# Patient Record
Sex: Female | Born: 1980 | Race: Asian | Hispanic: No | Marital: Married | Smoking: Never smoker
Health system: Southern US, Community
[De-identification: ages and names within clinical notes are randomized; demographics above are authoritative.]

## PROBLEM LIST (undated history)

## (undated) ENCOUNTER — Ambulatory Visit (HOSPITAL_COMMUNITY): Admission: EM | Source: Home / Self Care

## (undated) DIAGNOSIS — J45909 Unspecified asthma, uncomplicated: Secondary | ICD-10-CM

## (undated) DIAGNOSIS — IMO0001 Reserved for inherently not codable concepts without codable children: Secondary | ICD-10-CM

## (undated) DIAGNOSIS — O24419 Gestational diabetes mellitus in pregnancy, unspecified control: Secondary | ICD-10-CM

## (undated) HISTORY — DX: Unspecified asthma, uncomplicated: J45.909

---

## 2008-03-19 ENCOUNTER — Encounter: Payer: Self-pay | Admitting: Sports Medicine

## 2008-04-01 ENCOUNTER — Encounter: Payer: Self-pay | Admitting: Sports Medicine

## 2008-04-01 ENCOUNTER — Ambulatory Visit: Payer: Self-pay | Admitting: Family Medicine

## 2008-04-01 LAB — CONVERTED CEMR LAB
Antibody Screen: NEGATIVE
Basophils Absolute: 0.1 10*3/uL (ref 0.0–0.1)
Basophils Relative: 1 % (ref 0–1)
Eosinophils Absolute: 0.5 K/uL (ref 0.0–0.7)
Eosinophils Relative: 5 % (ref 0–5)
HCT: 35.9 % — ABNORMAL LOW (ref 36.0–46.0)
Hemoglobin: 12.1 g/dL (ref 12.0–15.0)
Hepatitis B Surface Ag: NEGATIVE
Lymphocytes Relative: 25 % (ref 12–46)
Lymphs Abs: 2.5 10*3/uL (ref 0.7–4.0)
MCHC: 33.7 g/dL (ref 30.0–36.0)
MCV: 91.3 fL (ref 78.0–100.0)
Monocytes Absolute: 0.6 10*3/uL (ref 0.1–1.0)
Monocytes Relative: 6 % (ref 3–12)
Neutro Abs: 6.5 10*3/uL (ref 1.7–7.7)
Neutrophils Relative %: 65 % (ref 43–77)
Platelets: 215 10*3/uL (ref 150–400)
RBC: 3.93 M/uL (ref 3.87–5.11)
RDW: 12.6 % (ref 11.5–15.5)
Rh Type: POSITIVE
Rubella: 98.1 intl units/mL — ABNORMAL HIGH
Sickle Cell Screen: NEGATIVE
Urine culture (CF units/mL): NEGATIVE CFunits/mL
WBC: 10.1 10*3/uL (ref 4.0–10.5)

## 2008-04-02 ENCOUNTER — Encounter: Payer: Self-pay | Admitting: Sports Medicine

## 2008-04-08 ENCOUNTER — Ambulatory Visit: Payer: Self-pay | Admitting: Family Medicine

## 2008-04-08 ENCOUNTER — Encounter: Payer: Self-pay | Admitting: Sports Medicine

## 2008-04-08 LAB — CONVERTED CEMR LAB
Chlamydia, DNA Probe: NEGATIVE
GC Probe Amp, Genital: NEGATIVE
Glucose, Urine, Semiquant: 100
Protein, U semiquant: NEGATIVE

## 2008-04-18 ENCOUNTER — Encounter: Payer: Self-pay | Admitting: Sports Medicine

## 2008-05-06 ENCOUNTER — Ambulatory Visit: Payer: Self-pay | Admitting: Family Medicine

## 2008-05-12 ENCOUNTER — Ambulatory Visit: Payer: Self-pay | Admitting: Family Medicine

## 2008-06-20 ENCOUNTER — Ambulatory Visit: Payer: Self-pay | Admitting: Family Medicine

## 2008-06-20 DIAGNOSIS — O4400 Placenta previa specified as without hemorrhage, unspecified trimester: Secondary | ICD-10-CM

## 2008-06-20 DIAGNOSIS — O99810 Abnormal glucose complicating pregnancy: Secondary | ICD-10-CM

## 2008-06-21 ENCOUNTER — Encounter: Payer: Self-pay | Admitting: *Deleted

## 2008-06-27 ENCOUNTER — Telehealth: Payer: Self-pay | Admitting: Sports Medicine

## 2008-06-27 ENCOUNTER — Ambulatory Visit: Payer: Self-pay | Admitting: Obstetrics & Gynecology

## 2008-06-27 ENCOUNTER — Encounter: Admission: RE | Admit: 2008-06-27 | Discharge: 2008-09-25 | Payer: Self-pay | Admitting: Obstetrics & Gynecology

## 2008-06-28 ENCOUNTER — Telehealth (INDEPENDENT_AMBULATORY_CARE_PROVIDER_SITE_OTHER): Payer: Self-pay | Admitting: *Deleted

## 2008-07-04 ENCOUNTER — Ambulatory Visit: Payer: Self-pay | Admitting: Obstetrics & Gynecology

## 2008-07-11 ENCOUNTER — Ambulatory Visit: Payer: Self-pay | Admitting: Family Medicine

## 2008-07-11 ENCOUNTER — Encounter: Payer: Self-pay | Admitting: Family

## 2008-07-11 LAB — CONVERTED CEMR LAB
Hemoglobin: 12.4 g/dL (ref 12.0–15.0)
RDW: 12.7 % (ref 11.5–15.5)
WBC: 9.6 10*3/uL (ref 4.0–10.5)

## 2008-07-18 ENCOUNTER — Ambulatory Visit: Payer: Self-pay | Admitting: Obstetrics & Gynecology

## 2008-08-01 ENCOUNTER — Ambulatory Visit: Payer: Self-pay | Admitting: Family Medicine

## 2008-08-02 ENCOUNTER — Ambulatory Visit (HOSPITAL_COMMUNITY): Admission: RE | Admit: 2008-08-02 | Discharge: 2008-08-02 | Payer: Self-pay | Admitting: Family Medicine

## 2008-08-04 ENCOUNTER — Ambulatory Visit: Payer: Self-pay | Admitting: Obstetrics & Gynecology

## 2008-08-04 ENCOUNTER — Ambulatory Visit (HOSPITAL_COMMUNITY): Admission: RE | Admit: 2008-08-04 | Discharge: 2008-08-04 | Payer: Self-pay | Admitting: Family Medicine

## 2009-11-05 ENCOUNTER — Emergency Department (HOSPITAL_COMMUNITY): Admission: EM | Admit: 2009-11-05 | Discharge: 2009-11-05 | Payer: Self-pay | Admitting: Internal Medicine

## 2010-04-01 ENCOUNTER — Encounter: Payer: Self-pay | Admitting: Sports Medicine

## 2010-04-01 ENCOUNTER — Encounter: Payer: Self-pay | Admitting: *Deleted

## 2010-04-02 ENCOUNTER — Encounter: Payer: Self-pay | Admitting: *Deleted

## 2010-04-10 NOTE — Miscellaneous (Signed)
 Summary: Release of Responsibility for Interpretation  Release of Responsibility for Interpretation   Imported By: [facility] KIVETT 04/25/2008 09:25:15  _____________________________________________________________________  External Attachment:    Type:   Image     Comment:   External Document

## 2010-04-10 NOTE — Assessment & Plan Note (Signed)
 Summary: NOB/AAM/DSL   Vital Signs:  Patient Profile:   30 Years Old Female Weight:      116.4 pounds BP sitting:   106 / 70  Menstrual History: Best Working EDC: 09/07/2008 LMP - Character: normal LMP - Reliable: No Menarche: 15 Menses interval: 30 days Menstrual flow: 4 days On BCP's at conception: no                 PCP:  Marge Vandermeulen   History of Present Illness: 24 G1P0 presents for initial intake OB visit.  Accompanied by family member who will translate in vietnamese.  They have signed the waiver for [facility] translator.  Conception occurred in [place], and father of baby is unknown and still in [place].    [employer]    Past Medical History:    Reviewed history and no changes required:       None  Past Surgical History:    Reviewed history and no changes required:       None   Family History:    Reviewed history and no changes required:       None.  Social History:    Reviewed history and no changes required:       Ad[employer]-Mom       No A/T/D.       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       College educated.   Risk Factors:    Review of Systems       12 point ROS negative except as outlined in HPI and flowsheet.   Physical Exam  General:     Well-developed,well-nourished,in no acute distress; alert,appropriate and cooperative throughout examination Head:     Normocephalic and atraumatic without obvious abnormalities. No apparent alopecia or balding. Eyes:     No corneal or conjunctival inflammation noted. EOMI. Perrla. Funduscopic exam benign, without hemorrhages, exudates or papilledema. Vision grossly normal. Ears:     External ear exam shows no significant lesions or deformities.  Otoscopic examination reveals clear canals, tympanic membranes are intact bilaterally without bulging, retraction, inflammation or discharge. Hearing is grossly normal bilaterally. Nose:     External nasal examination shows no deformity or inflammation. Nasal  mucosa are pink and moist without lesions or exudates. Mouth:     Oral mucosa and oropharynx without lesions or exudates.  Teeth in good repair. Neck:     No deformities, masses, or tenderness noted. Chest Wall:     No deformities, masses, or tenderness noted. Lungs:     Normal respiratory effort, chest expands symmetrically. Lungs are clear to auscultation, no crackles or wheezes. Heart:     Normal rate and regular rhythm. S1 and S2 normal without gallop, murmur, click, rub or other extra sounds. Abdomen:     Bowel sounds positive,abdomen soft and non-tender, organomegaly or hernias noted.  Gravid. Genitalia:     Normal introitus for age, no external lesions, no vaginal discharge, mucosa pink and moist, no vaginal or cervical lesions, no vaginal atrophy, no friaility or hemorrhage, normal uterus size and position, no adnexal masses or tenderness Msk:     No deformity or scoliosis noted of thoracic or lumbar spine.   Extremities:     No clubbing, cyanosis, edema, or deformity noted with normal full range of motion of all joints.   Neurologic:     No cranial nerve deficits noted. Station and gait are normal. Plantar reflexes are down-going bilaterally. DTRs are symmetrical throughout. Sensory, motor and coordinative functions appear intact.  Skin:     Intact without suspicious lesions or rashes Cervical Nodes:     No lymphadenopathy noted Additional Exam:     FHR auscultated @ 166 BPM    Impression & Recommendations:  Problem # 1:  SUPERVISION OF NORMAL FIRST PREGNANCY (ICD-V22.0) Doing well, routing 18.2 weeks, FHR auscultated @ 166BPM.  RTC in 4 weeks.  18 weeks anatomy ultrasound ordered, patient to call and schedule.  Discussed plans for breastfeeding and anesthesia as well as any red flags that should prompt seeking medical attention.  They are Ad[employer]-Mom and plan to apply for emergency medicaid after deliver.  Refused Integrated and quad screens.  Undecided contraception,  desires epidural, breastfeeding.  For next visit: 1h glucola: UA showed 100 glucose Review anatomy U/S Routine OB visit stuff: red flags, weight gain, PNV compliance, interval changes.  @ 28 wks: RPR, HIV, Glucola, Kick counts,    Orders: Prenatal U/S > 14 weeks - 23194 (Prenatal U/S) GC/Chlamydia-[facility] (87591/87491) Pap Smear-[facility] ([id number]) Medicaid OB visit - [facility] (00786)   Complete Medication List: 1)  Prenatal Vitamins 0.8 Mg Tabs (Prenatal multivit-min-fe-fa) .... One tab by mouth daily   Patient Instructions: 1)  Nice to meet you today, 2)  I want to see you back in 4 weeks. 3)  You will get your ultrasound today. 4)  Everything seems to be going well in the pregnancy. 5)  If you start to feel contractions, have any vaginal bleeding, fluid leakage then call the office or go to the [facility] MAU.   6)  -Dr. ONEIDA.   Prescriptions: PRENATAL VITAMINS 0.8 MG TABS (PRENATAL MULTIVIT-MIN-FE-FA) One tab by mouth daily  #31 x 9   Entered and Authorized by:   DEBBY PETTIES MD   Signed by:   DEBBY PETTIES MD on 04/08/2008   Method used:   Print then Give to Patient   RxID:   [id number]    OB Initial Intake Information    Positive HCG by: self    Race: Oriental/Asian    Marital status: Married    Occupation: outside work    Type of work: Nails    Number of children at home: 0  FOB Information    Husband/Father of baby: Unknown    FOB occupation Unknown    FOB Comments: Unknown, conception in [place], first ultrasound in [place].  Menstrual History    Best Working EDC: 09/07/2008    LMP - Character: normal    LMP - Reliable? : No    Menarche: 15 years    Menses interval: 30 days    Menstrual flow 4 days    On BCP's at conception: no    Pre Pregnancy Weight: 100 lbs.    Symptoms since LMP: amenorrhea, fatigue, tender breasts, urinary frequency   Flowsheet View for Follow-up Visit    Estimated weeks of       gestation:     18 2/7     Weight:     116.4    Blood pressure:   106 / 70    Urine protein:       negative    Urine glucose:    100    Hx headache?     No    Nausea/vomiting?   No    Edema?     0    Bleeding?     no    Leakage/discharge?   no    Fetal activity:       N/A    Labor  symptoms?   no    Fundal height:      just below umbilicus    FHR:       166    Taking Vitamins?   Y    Smoking PPD:   n/a    Comment:     Initial OB visit, @ 18.2w now.    Next visit:     4 wk    Resident:     Curtis    Preceptor:     Rosalynn   Prenatal Visit    FOB name: Unknown Concerns noted: FOB is unknown, pregnancy determined by initial ultrasound in [place] @ 13 weeks.  LMP Unknown. EDC Confirmation:    New working [facility]: 09/07/2008    LMP reliable? No    EDC by conception date: 09/07/2008 Ultrasound Dating Information:    First U/S on 03/07/2008   Gest age: 65.6   EDC: 09/07/2008.    Gest age by current sono: 18W 2D    EDC by current sono: 09/07/2008   Past Pregnancy History    Gravida:     1    Term Births:     0    Premature Births:   0    Living Children:   0    Para:       0    Mult. Births:     0    Prev C-Section:   0    Prev. VBAC attempt?   none    Aborta:     0    Elect. Ab:     0    Spont. Ab:     0    Ectopics:     0   Genetic History    Genetic History Reviewed:     Thalassemia:     mother: no    Neural tube defect:   mother: no    Down's Syndrome:   mother: no    Tay-Sachs:     mother: no    Sickle Cell Dz/Trait:   mother: no    Hemophilia:     mother: no    Muscular Dystrophy:   mother: no    Cystic Fibrosis:   mother: no    Huntington's Dz:   mother: no    Mental Retardation:   mother: no    Fragile X:     mother: no    Other Genetic or       Chromosomal Dz:   mother: no    Child with other       birth defect:     mother: no    > 3 spont. abortions:   mother: no    Hx of stillbirth:     mother: no  Infection Risk History    Infection Risk History Reviewed:    High Risk  Hepatitis B: no    Immunized against Hepatitis B: no    Exposure to TB: no    Patient with history of Genital Herpes: no    Sexual partner with history of Genital Herpes: no    History of STD (GC, Chlamydia, Syphilis, HPV): no    Rash, Viral, or Febrile Illness since LMP: no    Exposure to Cat Litter: no    Chicken Pox Immune Status: Non-immune    History of Parvovirus (Fifth Disease): no    Occupational Exposure to Children: none  Environmental Exposures    Environmental Exposures Reviewed:    Xray Exposure since LMP: no  Chemical or other exposure: no    Medication, drug, or alcohol use since LMP: no   Prenatal Lab Monitoring, Entry, and Tracking  Initial Prenatal Lab:  TEST                  VALUE          DATE           REVIEWED  Blood Type:           B+             04/01/2008      Urine Culture:        negative       04/01/2008          Laboratory Results   Urine Tests  Date/Time Received: April 08, 2008 2:13 PM  Date/Time Reported: April 08, 2008 2:41 PM   Routine Urinalysis   Glucose: 100   (Normal Range: Negative) Protein: negative   (Normal Range: Negative)    Comments: ...............test performed by......SABRABonnie A. Jordan, MT (ASCP)

## 2010-04-10 NOTE — Progress Notes (Signed)
 Summary: phn msg  Phone Note Call from Patient Call back at [facility] Phone [phone]   Caller: Patient Summary of Call: needs to ask question/would not say what. Initial call taken by: Karna Seminole,  June 27, 2008 3:09 PM  Follow-up for Phone Call        [facility] & [facility] want [facility] ultrasound to see how the baby is moving according to pt. she is reluctant to do this as she has no insurance & no money. wants pcp to know & to determine if she absolutely must do this. message to pcp Follow-up by: Ginnie Mau RN,  June 27, 2008 3:10 PM  Additional Follow-up for Phone Call Additional follow up Details #1::        She is Adopt-[employer]-mom, that should help with some of the cost but I would RECOMMEND listening to the High Risk folks.    She does have the capacity to make the decision however if she doesn't want the ultrasound.  Please let her know. Additional Follow-up by: Debby Petties MD,  June 27, 2008 3:42 PM      Appended Document: phn msg I tried to reach her but her voice mail has not been set up  Appended Document: phn msg left message last night. reached her this am. told her pcp here strongly recommends she get the u/s. told her to check with [employer] for help with cost. she is still not sure she wants to spend the money.

## 2010-04-10 NOTE — Assessment & Plan Note (Signed)
 Summary: ob/kh   Vital Signs:  Patient profile:   30 year old female Weight:      134.8 pounds Pulse rate:   81 / minute BP sitting:   105 / 62  Vitals Entered By: Katie Mulberry LPN (June 20, 2008 9:19 AM) CC: ob f/u Pain Assessment Patient in pain? no        History of Present Illness: 67F at 28.5 weeks, routine OB visit.  No complaints, no warning signs or red flags, has partial placenta previa no bleeding, no CTX, no fluid leakage.    Habits & Providers     Cigarette Packs/Day: n/a  Past History:  Past Medical History:    None (04/08/2008)  Past Surgical History:    None (04/08/2008)  Family History:    None. (04/08/2008)  Social History:    Adopt-a-Mom    No A/T/D.    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    College educated. (04/08/2008)  Review of Systems       12 point negative except as in HPI  Physical Exam  General:  Well-developed,well-nourished,in no acute distress; alert,appropriate and cooperative throughout examination Lungs:  Normal respiratory effort, chest expands symmetrically. Lungs are clear to auscultation, no crackles or wheezes. Heart:  Normal rate and regular rhythm. S1 and S2 normal without gallop, murmur, click, rub or other extra sounds. Abdomen:  soft, gravid, measures 28 cm, +BS   Impression & Recommendations:  Problem # 1:  SUPERVISION OF NORMAL FIRST PREGNANCY (ICD-V22.0) Pt with partial placenta previa on last ultrasound, no bleeding, asymptomatic.   Elevated 1h Glucola: GDM.  Otherwise normal pregnancy, discussed red flags and labor signs and symptoms.    -Will need serial ultrasounds q4 weeks to assess for change in placenta previa.   -Will also need referral to [facility] for 1h glucola:202.  Referral made.    Orders: Glucose 1 hr-[facility] (82950) Medicaid OB visit - [facility] (00786) Obstetric Referral (Obstetric)  Complete Medication List: 1)  Prenatal Vitamins 0.8 Mg Tabs (Prenatal multivit-min-fe-fa) .... One tab by  mouth daily  Patient Instructions: 1)  Your pregnancy seems to be going normally.  Come back to see me in 2 weeks.  If you have any vaginal bleeding, stop feeling the baby kick, fluid leakage, or regular contractions more than every 5 mins then go straight to [facility] for evaluation. 2)  We will do your 1 hour glucola today. 3)  -Dr. IVAR [place] for Follow-up Visit    Estimated weeks of       gestation:     28 5/7    Weight:     134.8    Blood pressure:   105 / 62    Headache:     No    Nausea/vomiting:   No    Edema:     0    Vaginal bleeding:   no    Vaginal discharge:   no    Fundal height:      28    FHR:       140    Fetal activity:     yes    Labor symptoms:   no    Fetal position:     transv    Taking prenatal vits?   Y    Smoking:     n/a    Next visit:     2 wk    Resident:     Curtis  Preceptor:     Levonne    Comment:     No complaints, feels baby kicking often.    Flowsheet View for Follow-up Visit    Estimated weeks of       gestation:     28 5/7    Weight:     134.8    Blood pressure:   105 / 62    Hx headache?     No    Nausea/vomiting?   No    Edema?     0    Bleeding?     no    Leakage/discharge?   no    Fetal activity:       yes    Labor symptoms?   no    Fundal height:      28    FHR:       140    Fetal position:      transv    Taking Vitamins?   Y    Smoking PPD:   n/a    Comment:     No complaints, feels baby kicking often.    Next visit:     2 wk    Resident:     Curtis    Preceptor:     Levonne

## 2010-06-19 LAB — POCT URINALYSIS DIP (DEVICE)
Bilirubin Urine: NEGATIVE
Bilirubin Urine: NEGATIVE
Glucose, UA: NEGATIVE mg/dL
Glucose, UA: NEGATIVE mg/dL
Hgb urine dipstick: NEGATIVE
Hgb urine dipstick: NEGATIVE
Hgb urine dipstick: NEGATIVE
Ketones, ur: 15 mg/dL — AB
Ketones, ur: NEGATIVE mg/dL
Ketones, ur: NEGATIVE mg/dL
Nitrite: NEGATIVE
Nitrite: NEGATIVE
Specific Gravity, Urine: 1.015 (ref 1.005–1.030)
Specific Gravity, Urine: 1.015 (ref 1.005–1.030)
Specific Gravity, Urine: 1.02 (ref 1.005–1.030)
Urobilinogen, UA: 0.2 mg/dL (ref 0.0–1.0)
pH: 6.5 (ref 5.0–8.0)
pH: 6.5 (ref 5.0–8.0)
pH: 6.5 (ref 5.0–8.0)

## 2010-06-20 LAB — POCT URINALYSIS DIP (DEVICE)
Bilirubin Urine: NEGATIVE
Glucose, UA: NEGATIVE mg/dL
Glucose, UA: NEGATIVE mg/dL
Hgb urine dipstick: NEGATIVE
Ketones, ur: NEGATIVE mg/dL
Ketones, ur: NEGATIVE mg/dL
Nitrite: NEGATIVE
Protein, ur: NEGATIVE mg/dL
Specific Gravity, Urine: 1.02 (ref 1.005–1.030)
Urobilinogen, UA: 0.2 mg/dL (ref 0.0–1.0)
pH: 6.5 (ref 5.0–8.0)
pH: 7 (ref 5.0–8.0)

## 2011-03-12 NOTE — L&D Delivery Note (Signed)
Operative Delivery Note At 7:39 PM a viable and healthy female was delivered via Vaginal, Vacuum Investment banker, operational).  Presentation: vertex; Position: Left,, Occiput,, Anterior; Station: +3.  Vacuum delivery performed with the kiwi vacuum due to maternal exhaustion and poor maternal expulsive effort.  The vacuum was applied at +3 station.  Initially the patient pushed poorly and pop off x 1 occurred.  After coaching the patient pushed and the head was guided to crowning with the vacuum.  The vacuum was disengaged and the infants body was then delivered.  The infant cried vigorously after delivery and was passed to the waiting maternal abdomen.  The cord was clamped and cut and the placenta delivered spontaneously, intact, with 3VC. A 1st degree vaginal laceration was repaired with 3-0 vicryl.  EBL 350cc  Verbal consent: obtained from patient.  Risks and benefits discussed in detail.  Risks include, but are not limited to the risks of anesthesia, bleeding, infection, damage to maternal tissues, fetal cephalhematoma.  There is also the risk of inability to effect vaginal delivery of the head, or shoulder dystocia that cannot be resolved by established maneuvers, leading to the need for emergency cesarean section.  APGAR: 8, 9; weight 7 lb 13.9 oz (3569 g).   Placenta status: Intact, Spontaneous.   Cord: 3 vessels  Anesthesia: Epidural  Instruments: kiwi vacuum Episiotomy: None Lacerations: 1st degree;Vaginal Suture Repair: 3.0 vicryl Est. Blood Loss (mL): 350cc  Mom to postpartum.  Baby to nursery-stable.  Earnie Bechard H. 11/20/2011, 8:10 PM

## 2011-04-17 ENCOUNTER — Other Ambulatory Visit: Payer: Self-pay | Admitting: Obstetrics and Gynecology

## 2011-05-08 LAB — OB RESULTS CONSOLE RPR
RPR: NONREACTIVE
RPR: NONREACTIVE

## 2011-05-08 LAB — OB RESULTS CONSOLE HIV ANTIBODY (ROUTINE TESTING): HIV: NONREACTIVE

## 2011-05-11 ENCOUNTER — Encounter (HOSPITAL_COMMUNITY): Payer: Self-pay | Admitting: Emergency Medicine

## 2011-05-11 ENCOUNTER — Emergency Department (HOSPITAL_COMMUNITY)
Admission: EM | Admit: 2011-05-11 | Discharge: 2011-05-12 | Disposition: A | Discharge status: Home / Self Care | Payer: Medicaid Other | Attending: Emergency Medicine | Admitting: Emergency Medicine

## 2011-05-11 DIAGNOSIS — N898 Other specified noninflammatory disorders of vagina: Secondary | ICD-10-CM | POA: Insufficient documentation

## 2011-05-11 DIAGNOSIS — N949 Unspecified condition associated with female genital organs and menstrual cycle: Secondary | ICD-10-CM | POA: Insufficient documentation

## 2011-05-11 DIAGNOSIS — R109 Unspecified abdominal pain: Secondary | ICD-10-CM | POA: Insufficient documentation

## 2011-05-11 DIAGNOSIS — O9989 Other specified diseases and conditions complicating pregnancy, childbirth and the puerperium: Secondary | ICD-10-CM | POA: Insufficient documentation

## 2011-05-11 DIAGNOSIS — O26899 Other specified pregnancy related conditions, unspecified trimester: Secondary | ICD-10-CM

## 2011-05-11 HISTORY — DX: Reserved for inherently not codable concepts without codable children: IMO0001

## 2011-05-11 LAB — URINE MICROSCOPIC-ADD ON

## 2011-05-11 LAB — URINALYSIS, ROUTINE W REFLEX MICROSCOPIC
Bilirubin Urine: NEGATIVE
Glucose, UA: NEGATIVE mg/dL
Ketones, ur: NEGATIVE mg/dL
Nitrite: NEGATIVE
Protein, ur: NEGATIVE mg/dL
Specific Gravity, Urine: 1.016 (ref 1.005–1.030)
Urobilinogen, UA: 0.2 mg/dL (ref 0.0–1.0)
pH: 6 (ref 5.0–8.0)

## 2011-05-11 NOTE — ED Notes (Signed)
Patient complaining of abdominal pain that stretches across her abdomen since this morning; patient 3 months pregnant.  Patient states that she also had a scant amount of vaginal bleeding this morning; denies bleeding at this moment. Denies nausea, vomiting, and diarrhea.

## 2011-05-12 ENCOUNTER — Emergency Department (HOSPITAL_COMMUNITY): Payer: Medicaid Other

## 2011-05-12 LAB — HCG, QUANTITATIVE, PREGNANCY: hCG, Beta Chain, Quant, S: 50838 m[IU]/mL — ABNORMAL HIGH (ref <5)

## 2011-05-12 NOTE — ED Provider Notes (Signed)
History     CSN: [id number]  Arrival date & time 05/11/11  2042   First MD Initiated Contact with Patient 05/11/11 2220      Chief Complaint  Patient presents with  . Abdominal Pain  . Vaginal Bleeding    (Consider location/radiation/quality/duration/timing/severity/associated sxs/prior treatment) HPI Patient presents to the emergency department with lower abdominal pain and a minimal amount of vaginal bleeding that occurred this morning.  Patient is 3 months pregnant and had no complications thus far during her pregnancy.  Patient states that the bleeding was very small amount.  She denies chest pain, shortness of breath, nausea/vomiting, back pain,or vaginal discomfort.  She also denies having any fevers.  Patient states that the bleeding has not occurred since this morning and she is not having any current pain.  She states that she is here to make sure that everything is okay with her pregnancy. Past Medical History  Diagnosis Date  . No significant active problems     History reviewed. No pertinent past surgical history.  History reviewed. No pertinent family history.  History  Substance Use Topics  . Smoking status: Never Smoker   . Smokeless tobacco: Not on file  . Alcohol Use: No      Review of Systems All pertinent positives and negatives reviewed in the history of present illness  Allergies  Review of patient's allergies indicates no known allergies.  Home Medications   Current Outpatient Rx  Name Route Sig Dispense Refill  . ACETAMINOPHEN 325 MG PO TABS Oral Take 325 mg by mouth every 6 (six) hours as needed. For pain    . PRENATAL 27-0.8 MG PO TABS Oral Take 1 tablet by mouth daily.      BP 98/59  Pulse 76  Temp(Src) 98.2 F (36.8 C) (Oral)  Resp 16  SpO2 99%  Physical Exam  Constitutional: He is oriented to person, place, and time. He appears well-developed and well-nourished.  HENT:  Head: Normocephalic and atraumatic.  Eyes: Pupils are equal,  round, and reactive to light.  Cardiovascular: Normal rate, regular rhythm, normal heart sounds and intact distal pulses.  Exam reveals no gallop and no friction rub.   No murmur heard. Pulmonary/Chest: Effort normal and breath sounds normal. No respiratory distress. He has no wheezes. He has no rales.  Abdominal: Soft. Bowel sounds are normal. He exhibits no distension. There is no tenderness. There is no rebound and no guarding.  Genitourinary:       The patient has no vaginal bleeding on exam.  Has no other signs of discharge.  Neurological: He is alert and oriented to person, place, and time.  Skin: Skin is warm and dry. No rash noted.    ED Course  Procedures (including critical care time)  Labs Reviewed  CBC - Abnormal; Notable for the following:    WBC 13.7 (*)    RBC 3.92 (*)    Hemoglobin 12.4 (*)    HCT 34.7 (*)    All other components within normal limits  BASIC METABOLIC PANEL - Abnormal; Notable for the following:    Creatinine, Ser 0.42 (*)    All other components within normal limits  URINALYSIS, ROUTINE W REFLEX MICROSCOPIC - Abnormal; Notable for the following:    APPearance CLOUDY (*)    Hgb urine dipstick TRACE (*)    Leukocytes, UA SMALL (*)    All other components within normal limits  HCG, QUANTITATIVE, PREGNANCY - Abnormal; Notable for the following:    hCG,  Beta Francene Finders [id number] (*)    All other components within normal limits  URINE MICROSCOPIC-ADD ON - Abnormal; Notable for the following:    Squamous Epithelial / LPF FEW (*)    Bacteria, UA FEW (*)    All other components within normal limits  LAB REPORT - SCANNED   US Ob Comp Less 14 Wks  05/12/2011  *RADIOLOGY REPORT*  Clinical Data: Vaginal bleeding  LIMITED OBSTETRIC ULTRASOUND  Number of Fetuses: 1 Heart Rate: 175bpm Movement: Yes Presentation: Variable Placental Location: Low lying Previa: Marginal Amniotic Fluid (Subjective): Normal  BPD: 2.2cm   13w   5d  MATERNAL FINDINGS: Cervix: Closed/  Uterus/Adnexae: Normal  IMPRESSION:  1.  Single living intrauterine pregnancy 2.  Low lying, and marginally located placenta.  Recommend followup with non-emergent complete OB 14+ wk US examination for fetal biometric evaluation and anatomic survey. This could be performed at the [facility] [place].  Original Report Authenticated By: Rosealee Albee, M.D.   US Ob Transvaginal  05/12/2011  *RADIOLOGY REPORT*  Clinical Data: Vaginal bleeding  LIMITED OBSTETRIC ULTRASOUND  Number of Fetuses: 1 Heart Rate: 175bpm Movement: Yes Presentation: Variable Placental Location: Low lying Previa: Marginal Amniotic Fluid (Subjective): Normal  BPD: 2.2cm   13w   5d  MATERNAL FINDINGS: Cervix: Closed/ Uterus/Adnexae: Normal  IMPRESSION:  1.  Single living intrauterine pregnancy 2.  Low lying, and marginally located placenta.  Recommend followup with non-emergent complete OB 14+ wk US examination for fetal biometric evaluation and anatomic survey. This could be performed at the [facility] [place].  Original Report Authenticated By: Rosealee Albee, M.D.     1. Pregnancy related pelvic pain, antepartum    Patient has not had any further pain or vaginal bleeding here in the emergency department.  Her ultrasound does not show any abnormalities.  Patient is advised to go to the [facility] for any further issues or worsening in her condition.  Husband states they tried to go there but did not realize there is an area could not be evaluated.  Patient does have a GYN doctor and I have advised him to followup with them next week.  Patient is also advised to return here as needed.  All questions and concerns were addressed.   MDM  MDM Reviewed: nursing note and vitals Interpretation: labs and ultrasound            Carlyle Dolly, PA-C 05/12/11 1506

## 2011-05-12 NOTE — Discharge Instructions (Signed)
Return here as needed. Follow up at [facility] for any worsening in your condition.

## 2011-05-12 NOTE — ED Notes (Signed)
Patient back from US.

## 2011-05-12 NOTE — ED Provider Notes (Signed)
Medical screening examination/treatment/procedure(s) were performed by non-physician practitioner and as supervising physician I was immediately available for consultation/collaboration.  Kahner Yanik, MD 05/12/11 1637 

## 2011-05-29 LAB — BASIC METABOLIC PANEL
BUN: 8 mg/dL (ref 6–23)
CO2: 25 mEq/L (ref 19–32)
Calcium: 9.8 mg/dL (ref 8.4–10.5)
Chloride: 102 mEq/L (ref 96–112)
Creatinine, Ser: 0.42 mg/dL — ABNORMAL LOW (ref 0.50–1.10)
GFR calc Af Amer: >90 mL/min (ref >90)
GFR calc non Af Amer: >90 mL/min (ref >90)
Glucose, Bld: 98 mg/dL (ref 70–99)
Potassium: 3.6 mEq/L (ref 3.5–5.1)
Sodium: 138 mEq/L (ref 135–145)

## 2011-05-29 LAB — CBC
HCT: 34.7 % — ABNORMAL LOW (ref 36.0–46.0)
Hemoglobin: 12.4 g/dL (ref 12.0–15.0)
MCH: 31.6 pg (ref 26.0–34.0)
MCHC: 35.7 g/dL (ref 30.0–36.0)
MCV: 88.5 fL (ref 78.0–100.0)
Platelets: 242 10*3/uL (ref 150–400)
RBC: 3.92 MIL/uL (ref 3.87–5.11)
RDW: 12.1 % (ref 11.5–15.5)
WBC: 13.7 10*3/uL — ABNORMAL HIGH (ref 4.0–10.5)

## 2011-06-05 ENCOUNTER — Other Ambulatory Visit (HOSPITAL_COMMUNITY): Payer: Self-pay | Admitting: Obstetrics and Gynecology

## 2011-06-05 ENCOUNTER — Other Ambulatory Visit: Payer: Self-pay

## 2011-06-05 DIAGNOSIS — O2690 Pregnancy related conditions, unspecified, unspecified trimester: Secondary | ICD-10-CM

## 2011-06-05 DIAGNOSIS — Z3689 Encounter for other specified antenatal screening: Secondary | ICD-10-CM

## 2011-06-19 ENCOUNTER — Ambulatory Visit (HOSPITAL_COMMUNITY)
Admission: RE | Admit: 2011-06-19 | Discharge: 2011-06-19 | Disposition: A | Discharge status: Home / Self Care | Payer: Medicaid Other | Source: Ambulatory Visit | Attending: Obstetrics and Gynecology | Admitting: Obstetrics and Gynecology

## 2011-06-19 ENCOUNTER — Encounter (HOSPITAL_COMMUNITY): Payer: Self-pay

## 2011-06-19 VITALS — BP 107/67 | HR 73 | Wt 133.0 lb

## 2011-06-19 DIAGNOSIS — Z3689 Encounter for other specified antenatal screening: Secondary | ICD-10-CM

## 2011-06-19 DIAGNOSIS — Z363 Encounter for antenatal screening for malformations: Secondary | ICD-10-CM | POA: Insufficient documentation

## 2011-06-19 DIAGNOSIS — O2690 Pregnancy related conditions, unspecified, unspecified trimester: Secondary | ICD-10-CM

## 2011-06-19 DIAGNOSIS — Z8751 Personal history of pre-term labor: Secondary | ICD-10-CM | POA: Insufficient documentation

## 2011-06-19 DIAGNOSIS — O358XX0 Maternal care for other (suspected) fetal abnormality and damage, not applicable or unspecified: Secondary | ICD-10-CM | POA: Insufficient documentation

## 2011-06-19 DIAGNOSIS — Z1389 Encounter for screening for other disorder: Secondary | ICD-10-CM | POA: Insufficient documentation

## 2011-06-19 NOTE — Progress Notes (Signed)
Obstetric ultrasound performed today.    Patient had a nuchal translucency measurement in her primary OB office that was found to be at the 96th percentile for gestational age 30 1/[redacted] weeks gestation (2.5 mm).  First trimester screening was completed and results indicate no increased risks for fetal aneuploidy.  MSAFP is also WNL.    Ultrasound findings were discussed today and the patient spent time speaking with the genetic counselor.  She declined any further testing for fetal aneuploidy.  The option of fetal echo was reviewed and she elected to proceed with this.      Fetal measurements consistent with dating by early ultrasound Normal amniotic fluid volume Normal fetal anatomic survey (some limited face and heart views) No markers of aneuploidy identified  Repeat utlrasound scheduled in 6 weeks to re evaluate fetal anatomy.  Fetal echo also scheduled.  Please see full report in [facility].

## 2011-07-03 ENCOUNTER — Ambulatory Visit (HOSPITAL_COMMUNITY): Payer: Medicaid Other

## 2011-07-05 ENCOUNTER — Encounter (HOSPITAL_COMMUNITY): Payer: Self-pay

## 2011-07-31 ENCOUNTER — Ambulatory Visit (HOSPITAL_COMMUNITY): Payer: Medicaid Other

## 2011-08-07 ENCOUNTER — Ambulatory Visit (HOSPITAL_COMMUNITY)
Admission: RE | Admit: 2011-08-07 | Discharge: 2011-08-07 | Disposition: A | Discharge status: Home / Self Care | Payer: Medicaid Other | Source: Ambulatory Visit | Attending: Obstetrics and Gynecology | Admitting: Obstetrics and Gynecology

## 2011-08-07 VITALS — BP 99/65 | HR 82 | Wt 134.0 lb

## 2011-08-07 DIAGNOSIS — O358XX0 Maternal care for other (suspected) fetal abnormality and damage, not applicable or unspecified: Secondary | ICD-10-CM | POA: Insufficient documentation

## 2011-08-07 DIAGNOSIS — O2690 Pregnancy related conditions, unspecified, unspecified trimester: Secondary | ICD-10-CM

## 2011-08-07 DIAGNOSIS — Z8751 Personal history of pre-term labor: Secondary | ICD-10-CM | POA: Insufficient documentation

## 2011-08-07 DIAGNOSIS — Z3689 Encounter for other specified antenatal screening: Secondary | ICD-10-CM

## 2011-08-07 NOTE — Progress Notes (Signed)
Patient seen today  for follow up ultrasound.  See full report in [facility].  IUP at 24 weeks 1 day Interval growth is appropriate (48th %) Mild, left sided renal pylectasis is noted without evidence of calyceal dilation Normal amniotic fluid volume Normal fetal echo by peds cardiolgy.  Repeat utlrasound scheduled in 6 weeks to re evaluate the fetal kidneys.  Alpha Gula, MD

## 2011-09-18 ENCOUNTER — Encounter (HOSPITAL_COMMUNITY): Payer: Self-pay

## 2011-09-18 ENCOUNTER — Ambulatory Visit (HOSPITAL_COMMUNITY)
Admission: RE | Admit: 2011-09-18 | Discharge: 2011-09-18 | Disposition: A | Discharge status: Home / Self Care | Payer: Medicaid Other | Source: Ambulatory Visit | Attending: Obstetrics and Gynecology | Admitting: Obstetrics and Gynecology

## 2011-09-18 DIAGNOSIS — O358XX0 Maternal care for other (suspected) fetal abnormality and damage, not applicable or unspecified: Secondary | ICD-10-CM | POA: Insufficient documentation

## 2011-09-18 DIAGNOSIS — Z8751 Personal history of pre-term labor: Secondary | ICD-10-CM | POA: Insufficient documentation

## 2011-09-18 DIAGNOSIS — O09299 Supervision of pregnancy with other poor reproductive or obstetric history, unspecified trimester: Secondary | ICD-10-CM | POA: Insufficient documentation

## 2011-10-18 ENCOUNTER — Other Ambulatory Visit (HOSPITAL_COMMUNITY): Payer: Self-pay | Admitting: Obstetrics and Gynecology

## 2011-10-18 DIAGNOSIS — O358XX0 Maternal care for other (suspected) fetal abnormality and damage, not applicable or unspecified: Secondary | ICD-10-CM

## 2011-10-18 DIAGNOSIS — N2889 Other specified disorders of kidney and ureter: Secondary | ICD-10-CM

## 2011-10-18 DIAGNOSIS — Z8751 Personal history of pre-term labor: Secondary | ICD-10-CM

## 2011-10-18 DIAGNOSIS — O09299 Supervision of pregnancy with other poor reproductive or obstetric history, unspecified trimester: Secondary | ICD-10-CM

## 2011-10-23 ENCOUNTER — Ambulatory Visit (HOSPITAL_COMMUNITY)
Admission: RE | Admit: 2011-10-23 | Discharge: 2011-10-23 | Disposition: A | Discharge status: Home / Self Care | Payer: Medicaid Other | Source: Ambulatory Visit | Attending: Obstetrics and Gynecology | Admitting: Obstetrics and Gynecology

## 2011-10-23 DIAGNOSIS — O09299 Supervision of pregnancy with other poor reproductive or obstetric history, unspecified trimester: Secondary | ICD-10-CM | POA: Insufficient documentation

## 2011-10-23 DIAGNOSIS — N2889 Other specified disorders of kidney and ureter: Secondary | ICD-10-CM

## 2011-10-23 DIAGNOSIS — O358XX0 Maternal care for other (suspected) fetal abnormality and damage, not applicable or unspecified: Secondary | ICD-10-CM | POA: Insufficient documentation

## 2011-10-23 DIAGNOSIS — Z8751 Personal history of pre-term labor: Secondary | ICD-10-CM | POA: Insufficient documentation

## 2011-10-24 NOTE — Progress Notes (Signed)
Jessica Perry was seen for ultrasound appointment today.  Please see [facility] report for details.

## 2011-11-20 ENCOUNTER — Inpatient Hospital Stay (HOSPITAL_COMMUNITY)
Admission: AD | Admit: 2011-11-20 | Discharge: 2011-11-23 | DRG: 767 | Disposition: A | Discharge status: Home / Self Care | Payer: Medicaid Other | Source: Ambulatory Visit | Attending: Obstetrics and Gynecology | Admitting: Obstetrics and Gynecology

## 2011-11-20 ENCOUNTER — Encounter (HOSPITAL_COMMUNITY): Payer: Self-pay | Admitting: Anesthesiology

## 2011-11-20 ENCOUNTER — Encounter (HOSPITAL_COMMUNITY): Payer: Self-pay | Admitting: *Deleted

## 2011-11-20 ENCOUNTER — Telehealth (HOSPITAL_COMMUNITY): Payer: Self-pay | Admitting: *Deleted

## 2011-11-20 ENCOUNTER — Inpatient Hospital Stay (HOSPITAL_COMMUNITY): Payer: Medicaid Other | Admitting: Anesthesiology

## 2011-11-20 DIAGNOSIS — O4400 Placenta previa specified as without hemorrhage, unspecified trimester: Secondary | ICD-10-CM

## 2011-11-20 DIAGNOSIS — O99810 Abnormal glucose complicating pregnancy: Secondary | ICD-10-CM

## 2011-11-20 DIAGNOSIS — Z3480 Encounter for supervision of other normal pregnancy, unspecified trimester: Secondary | ICD-10-CM

## 2011-11-20 DIAGNOSIS — Z302 Encounter for sterilization: Secondary | ICD-10-CM

## 2011-11-20 HISTORY — DX: Gestational diabetes mellitus in pregnancy, unspecified control: O24.419

## 2011-11-20 LAB — ABO/RH: ABO/RH(D): B POS

## 2011-11-20 LAB — CBC
Hemoglobin: 12.3 g/dL (ref 12.0–15.0)
RBC: 4 MIL/uL (ref 3.87–5.11)
WBC: 13.7 10*3/uL — ABNORMAL HIGH (ref 4.0–10.5)

## 2011-11-20 LAB — RPR: RPR Ser Ql: NONREACTIVE

## 2011-11-20 LAB — TYPE AND SCREEN: Antibody Screen: NEGATIVE

## 2011-11-20 MED ORDER — ZOLPIDEM TARTRATE 5 MG PO TABS: 5.0000 mg | ORAL_TABLET | Freq: Every evening | ORAL | Status: DC | PRN

## 2011-11-20 MED ORDER — LIDOCAINE HCL (PF) 1 % IJ SOLN
30.0000 mL | INTRAMUSCULAR | Status: DC | PRN
  Filled 2011-11-20: qty 30

## 2011-11-20 MED ORDER — ONDANSETRON HCL 4 MG/2ML IJ SOLN
4.0000 mg | Freq: Four times a day (QID) | INTRAMUSCULAR | Status: DC | PRN
  Administered 2011-11-20: 4 mg via INTRAVENOUS
  Filled 2011-11-20: qty 2

## 2011-11-20 MED ORDER — EPHEDRINE 5 MG/ML INJ: 10.0000 mg | INTRAVENOUS | Status: DC | PRN

## 2011-11-20 MED ORDER — PRENATAL MULTIVITAMIN CH: 1.0000 | ORAL_TABLET | Freq: Every day | ORAL | Status: DC

## 2011-11-20 MED ORDER — DIPHENHYDRAMINE HCL 25 MG PO CAPS: 25.0000 mg | ORAL_CAPSULE | Freq: Four times a day (QID) | ORAL | Status: DC | PRN

## 2011-11-20 MED ORDER — OXYTOCIN BOLUS FROM INFUSION
500.0000 mL | Freq: Once | INTRAVENOUS | Status: DC
  Filled 2011-11-20: qty 500

## 2011-11-20 MED ORDER — FENTANYL 2.5 MCG/ML BUPIVACAINE 1/10 % EPIDURAL INFUSION (WH - ANES): 14.0000 mL/h | INTRAMUSCULAR | Status: DC

## 2011-11-20 MED ORDER — BENZOCAINE-MENTHOL 20-0.5 % EX AERO
1.0000 "application " | INHALATION_SPRAY | CUTANEOUS | Status: DC | PRN
  Administered 2011-11-21: 1 via TOPICAL
  Filled 2011-11-20 (×2): qty 56

## 2011-11-20 MED ORDER — DIPHENHYDRAMINE HCL 50 MG/ML IJ SOLN: 12.5000 mg | INTRAMUSCULAR | Status: DC | PRN

## 2011-11-20 MED ORDER — IBUPROFEN 600 MG PO TABS
600.0000 mg | ORAL_TABLET | Freq: Four times a day (QID) | ORAL | Status: DC
  Administered 2011-11-21 – 2011-11-22 (×4): 600 mg via ORAL
  Filled 2011-11-20 (×6): qty 1

## 2011-11-20 MED ORDER — PHENYLEPHRINE 40 MCG/ML (10ML) SYRINGE FOR IV PUSH (FOR BLOOD PRESSURE SUPPORT): 80.0000 ug | PREFILLED_SYRINGE | INTRAVENOUS | Status: DC | PRN

## 2011-11-20 MED ORDER — TERBUTALINE SULFATE 1 MG/ML IJ SOLN: 0.2500 mg | Freq: Once | INTRAMUSCULAR | Status: DC | PRN

## 2011-11-20 MED ORDER — OXYCODONE-ACETAMINOPHEN 5-325 MG PO TABS: 1.0000 | ORAL_TABLET | ORAL | Status: DC | PRN

## 2011-11-20 MED ORDER — OXYTOCIN 40 UNITS IN LACTATED RINGERS INFUSION - SIMPLE MED
1.0000 m[IU]/min | INTRAVENOUS | Status: DC
  Administered 2011-11-20: 2 m[IU]/min via INTRAVENOUS

## 2011-11-20 MED ORDER — OXYTOCIN 40 UNITS IN LACTATED RINGERS INFUSION - SIMPLE MED
62.5000 mL/h | Freq: Once | INTRAVENOUS | Status: DC
  Filled 2011-11-20: qty 1000

## 2011-11-20 MED ORDER — METHYLERGONOVINE MALEATE 0.2 MG/ML IJ SOLN
0.2000 mg | INTRAMUSCULAR | Status: DC | PRN
Start: 2011-11-20 — End: 2011-11-22

## 2011-11-20 MED ORDER — ACETAMINOPHEN 325 MG PO TABS: 650.0000 mg | ORAL_TABLET | ORAL | Status: DC | PRN

## 2011-11-20 MED ORDER — LIDOCAINE HCL (PF) 1 % IJ SOLN
INTRAMUSCULAR | Status: DC | PRN
  Administered 2011-11-20 (×4): 4 mL

## 2011-11-20 MED ORDER — ONDANSETRON HCL 4 MG PO TABS: 4.0000 mg | ORAL_TABLET | ORAL | Status: DC | PRN

## 2011-11-20 MED ORDER — SENNOSIDES-DOCUSATE SODIUM 8.6-50 MG PO TABS
2.0000 | ORAL_TABLET | Freq: Every day | ORAL | Status: DC
  Administered 2011-11-21: 2 via ORAL

## 2011-11-20 MED ORDER — FLEET ENEMA 7-19 GM/118ML RE ENEM: 1.0000 | ENEMA | RECTAL | Status: DC | PRN

## 2011-11-20 MED ORDER — LANOLIN HYDROUS EX OINT: TOPICAL_OINTMENT | CUTANEOUS | Status: DC | PRN

## 2011-11-20 MED ORDER — LACTATED RINGERS IV SOLN
500.0000 mL | Freq: Once | INTRAVENOUS | Status: AC
  Administered 2011-11-20: 300 mL via INTRAVENOUS

## 2011-11-20 MED ORDER — LACTATED RINGERS IV SOLN: 500.0000 mL | INTRAVENOUS | Status: DC | PRN

## 2011-11-20 MED ORDER — ONDANSETRON HCL 4 MG/2ML IJ SOLN: 4.0000 mg | INTRAMUSCULAR | Status: DC | PRN

## 2011-11-20 MED ORDER — DIBUCAINE 1 % RE OINT: 1.0000 "application " | TOPICAL_OINTMENT | RECTAL | Status: DC | PRN

## 2011-11-20 MED ORDER — METHYLERGONOVINE MALEATE 0.2 MG PO TABS: 0.2000 mg | ORAL_TABLET | ORAL | Status: DC | PRN

## 2011-11-20 MED ORDER — PHENYLEPHRINE 40 MCG/ML (10ML) SYRINGE FOR IV PUSH (FOR BLOOD PRESSURE SUPPORT)
80.0000 ug | PREFILLED_SYRINGE | INTRAVENOUS | Status: DC | PRN
  Filled 2011-11-20: qty 5

## 2011-11-20 MED ORDER — CITRIC ACID-SODIUM CITRATE 334-500 MG/5ML PO SOLN: 30.0000 mL | ORAL | Status: DC | PRN

## 2011-11-20 MED ORDER — TETANUS-DIPHTH-ACELL PERTUSSIS 5-2.5-18.5 LF-MCG/0.5 IM SUSP: 0.5000 mL | Freq: Once | INTRAMUSCULAR | Status: DC

## 2011-11-20 MED ORDER — LACTATED RINGERS IV SOLN
500.0000 mL | Freq: Once | INTRAVENOUS | Status: AC
  Administered 2011-11-20: 1000 mL via INTRAVENOUS

## 2011-11-20 MED ORDER — EPHEDRINE 5 MG/ML INJ
10.0000 mg | INTRAVENOUS | Status: DC | PRN
  Administered 2011-11-20: 10 mg via INTRAVENOUS
  Filled 2011-11-20: qty 4

## 2011-11-20 MED ORDER — LACTATED RINGERS IV SOLN
INTRAVENOUS | Status: DC
  Administered 2011-11-20 (×3): via INTRAVENOUS

## 2011-11-20 MED ORDER — IBUPROFEN 600 MG PO TABS: 600.0000 mg | ORAL_TABLET | Freq: Four times a day (QID) | ORAL | Status: DC | PRN

## 2011-11-20 MED ORDER — OXYCODONE-ACETAMINOPHEN 5-325 MG PO TABS
1.0000 | ORAL_TABLET | ORAL | Status: DC | PRN
Start: 2011-11-20 — End: 2011-11-22

## 2011-11-20 MED ORDER — FENTANYL 2.5 MCG/ML BUPIVACAINE 1/10 % EPIDURAL INFUSION (WH - ANES)
14.0000 mL/h | INTRAMUSCULAR | Status: DC
  Administered 2011-11-20 (×4): 14 mL/h via EPIDURAL
  Filled 2011-11-20 (×4): qty 60

## 2011-11-20 MED ORDER — SIMETHICONE 80 MG PO CHEW: 80.0000 mg | CHEWABLE_TABLET | ORAL | Status: DC | PRN

## 2011-11-20 MED ORDER — WITCH HAZEL-GLYCERIN EX PADS: 1.0000 "application " | MEDICATED_PAD | CUTANEOUS | Status: DC | PRN

## 2011-11-20 NOTE — Telephone Encounter (Signed)
Preadmission screen  

## 2011-11-20 NOTE — H&P (Signed)
31 y.o. [redacted]w[redacted]d  G3P0111 comes in c/o ctx.  Otherwise has good fetal movement and no bleeding.  No other complaints.  Cervical exam was the same as office at arrival.  Pt walked and progressed to 6cm with more painful ctx.  Past Medical History  Diagnosis Date  . No significant active problems   . Gestational diabetes     With first pregnancy   No past surgical history on file.  OB History    Grav Para Term Preterm Abortions TAB SAB Ect Mult Living   3 1 0 1 1 1 0 0 0 1      # Outc Date GA Lbr Len/2nd Wgt Sex Del Anes PTL Lv   1 PRE            2 TAB            3 CUR               History   Social History  . Marital Status: Single    Spouse Name: N/A    Number of Children: N/A  . Years of Education: N/A   Occupational History  . Not on file.   Social History Main Topics  . Smoking status: Never Smoker   . Smokeless tobacco: Not on file  . Alcohol Use: No  . Drug Use: No  . Sexually Active:    Other Topics Concern  . Not on file   Social History Narrative   ** Merged History Encounter **    Review of patient's allergies indicates no known allergies.   Prenatal Course:  Elevated NT and followed up with MFM for further eval.  Echo done was normal per dad.  Pt did not follow up in our office from 16-36 wks and missed 28 week labs.  She had hx of GDM first preg.  HBA1C was check and found to be normal at 5.5.  Filed Vitals:   11/20/11 0719  BP: 99/51  Pulse: 77  Temp:   Resp:      Lungs/Cor:  NAD Abdomen:  soft, gravid Ex:  no cords, erythema SVE:  8.5/100/0 FHTs:  130, good STV, +10x10 Toco:  q2-4   A/P   Admit to L&D S/p epidural received upon arrival Pt desires PPTL, consent needs to be obtained from office Other routine care.  GBS Neg  Renisha Cockrum

## 2011-11-20 NOTE — Anesthesia Procedure Notes (Signed)
Epidural Patient location during procedure: OB Start time: 11/20/2011 6:57 AM  Staffing Performed by: anesthesiologist   Preanesthetic Checklist Completed: patient identified, site marked, surgical consent, pre-op evaluation, timeout performed, IV checked, risks and benefits discussed and monitors and equipment checked  Epidural Patient position: sitting Prep: site prepped and draped and DuraPrep Patient monitoring: continuous pulse ox and blood pressure Approach: midline Injection technique: LOR air  Needle:  Needle type: Tuohy  Needle gauge: 17 G Needle length: 9 cm and 9 Needle insertion depth: 5 cm cm Catheter type: closed end flexible Catheter size: 19 Gauge Catheter at skin depth: 10 cm Test dose: negative  Assessment Events: blood not aspirated, injection not painful, no injection resistance, negative IV test and no paresthesia  Additional Notes Discussed risk of headache, infection, bleeding, nerve injury and failed or incomplete block.  Patient voices understanding and wishes to proceed. Reason for block:procedure for pain

## 2011-11-20 NOTE — Anesthesia Preprocedure Evaluation (Signed)
Anesthesia Evaluation  Patient identified by MRN, date of birth, ID band Patient awake    Reviewed: Allergy & Precautions, H&P , NPO status , Patient's Chart, lab work & pertinent test results, reviewed documented beta blocker date and time   History of Anesthesia Complications Negative for: history of anesthetic complications  Airway Mallampati: III TM Distance: >3 FB Neck ROM: full    Dental  (+) Teeth Intact   Pulmonary neg pulmonary ROS,  breath sounds clear to auscultation        Cardiovascular negative cardio ROS  Rhythm:regular Rate:Normal     Neuro/Psych negative neurological ROS  negative psych ROS   GI/Hepatic negative GI ROS, Neg liver ROS,   Endo/Other  negative endocrine ROSneg diabetes (GDM prior pregnancy)  Renal/GU negative Renal ROS  negative genitourinary   Musculoskeletal   Abdominal   Peds  Hematology negative hematology ROS (+)   Anesthesia Other Findings   Reproductive/Obstetrics (+) Pregnancy                           Anesthesia Physical Anesthesia Plan  ASA: II  Anesthesia Plan: Epidural   Post-op Pain Management:    Induction:   Airway Management Planned:   Additional Equipment:   Intra-op Plan:   Post-operative Plan:   Informed Consent: I have reviewed the patients History and Physical, chart, labs and discussed the procedure including the risks, benefits and alternatives for the proposed anesthesia with the patient or authorized representative who has indicated his/her understanding and acceptance.     Plan Discussed with:   Anesthesia Plan Comments:         Anesthesia Quick Evaluation

## 2011-11-20 NOTE — MAU Note (Signed)
Contractions for last 3 hours every 5 mins

## 2011-11-20 NOTE — Progress Notes (Signed)
Dr. Claiborne Billings notified patient arrived complaining of painful contractions. SVE 5/90/-2 at 0444. Contractions every 3-6 mins. Fetal tracing not reactive at this time however reassuring. Patient became increasing uncomfortable. Recheck at 0510 cervix unchanged. Increased nuchal translucency at 12 weeks in ultrasound. Echo shows normal cardiac. Appropriate fetal growth 74% on 8/14. Will continue to observe and recheck.

## 2011-11-21 ENCOUNTER — Encounter (HOSPITAL_COMMUNITY)
Admission: AD | Disposition: A | Discharge status: Home / Self Care | Payer: Self-pay | Source: Ambulatory Visit | Attending: Obstetrics and Gynecology

## 2011-11-21 LAB — CBC
HCT: 32.5 % — ABNORMAL LOW (ref 36.0–46.0)
Hemoglobin: 10.8 g/dL — ABNORMAL LOW (ref 12.0–15.0)
MCH: 31.2 pg (ref 26.0–34.0)
MCHC: 33.2 g/dL (ref 30.0–36.0)
MCV: 93.9 fL (ref 78.0–100.0)
Platelets: 168 10*3/uL (ref 150–400)
RBC: 3.46 MIL/uL — ABNORMAL LOW (ref 3.87–5.11)
RDW: 13.6 % (ref 11.5–15.5)
WBC: 19.1 10*3/uL — ABNORMAL HIGH (ref 4.0–10.5)

## 2011-11-21 LAB — MRSA PCR SCREENING: MRSA by PCR: POSITIVE — AB

## 2011-11-21 SURGERY — LIGATION, FALLOPIAN TUBE, POSTPARTUM
Anesthesia: Choice | Laterality: Bilateral

## 2011-11-21 MED ORDER — MUPIROCIN 2 % EX OINT
1.0000 "application " | TOPICAL_OINTMENT | Freq: Two times a day (BID) | CUTANEOUS | Status: DC
  Administered 2011-11-21: 1 via NASAL
  Filled 2011-11-21 (×2): qty 22

## 2011-11-21 MED ORDER — CHLORHEXIDINE GLUCONATE CLOTH 2 % EX PADS
6.0000 | MEDICATED_PAD | Freq: Every day | CUTANEOUS | Status: DC
  Administered 2011-11-21: 6 via TOPICAL

## 2011-11-21 MED ORDER — METOCLOPRAMIDE HCL 10 MG PO TABS
10.0000 mg | ORAL_TABLET | Freq: Once | ORAL | Status: AC
  Administered 2011-11-21: 10 mg via ORAL
  Filled 2011-11-21: qty 1

## 2011-11-21 MED ORDER — FAMOTIDINE 20 MG PO TABS
40.0000 mg | ORAL_TABLET | Freq: Once | ORAL | Status: AC
  Administered 2011-11-21: 40 mg via ORAL
  Filled 2011-11-21: qty 2

## 2011-11-21 MED ORDER — LACTATED RINGERS IV SOLN
INTRAVENOUS | Status: DC
  Administered 2011-11-21: 12:00:00 via INTRAVENOUS

## 2011-11-21 NOTE — Anesthesia Postprocedure Evaluation (Signed)
  Anesthesia Post-op Note  Patient: Jessica Perry  Procedure(s) Performed: * No procedures listed *  Patient Location: Mother/Baby  Anesthesia Type: Epidural  Level of Consciousness: awake  Airway and Oxygen Therapy: Patient Spontanous Breathing  Post-op Pain: none  Post-op Assessment: Patient's Cardiovascular Status Stable, Respiratory Function Stable, Patent Airway, No signs of Nausea or vomiting, Adequate PO intake, Pain level controlled, No headache, No backache, No residual numbness and No residual motor weakness  Post-op Vital Signs: Reviewed and stable  Complications: No apparent anesthesia complications

## 2011-11-21 NOTE — Progress Notes (Signed)
UR Chart review completed.  

## 2011-11-21 NOTE — Progress Notes (Signed)
It is now 13:15 and pt was about to be called for for preop.  She does not have an epidural.  I have 4 laboring patients and probably will have deliveries in the next 2-3 hours.  I explained to patient that I can not do her pp tubal today without risking keeping her NPO for an unreasonable time.  I apologized for her being NPO for so long.  She voiced understanding.  I suggested that she make plans for her BTL at 6 weeks pp when the surgery can be scheduled earlier and done by a doctor not on call.

## 2011-11-21 NOTE — Progress Notes (Signed)
Pt desires ppBTL.  Although pt does speak english, her husband was repeating what I said in [place].  I told pt I wanted her to be consented by interpreter services but she and husband vigorously declined because their dialect is not spoken by the interpreter services and it creates more confusion for her and her husband.  Pt did respond and voice understanding in english to me when I discussed that the BTL was permanent (she is sure she does not want any more children) and the possible risks of bleeding, infection, damage to internal organs and slight chance of pregnancy after BTL.  I d/w her that I would use filchie clips.

## 2011-11-21 NOTE — Progress Notes (Signed)
CRITICAL VALUE ALERT  Critical value received:  PCR- MRSA positive  Date of notification:  11/21/11  Time of notification:  0544  Critical value read back:yes  Nurse who received alert:  Mont Dutton, RN  MD notified (1st page):  Delray Alt (via EPIC progress notes- no page)  Time of first page:  [phone]  MD notified (2nd page):  Time of second page:  Responding MD: Nest[facility] via progress note- will notify on rounds  Time MD responded:  [phone]

## 2011-11-21 NOTE — Progress Notes (Signed)
Patient is eating, ambulating, voiding.  Pain control is good.  Filed Vitals:   11/20/11 2131 11/20/11 2233 11/21/11 0209 11/21/11 0546  BP: 114/60 120/60 99/74 107/62  Pulse: 73 73 86 69  Temp: 98.4 F (36.9 C) 98.9 F (37.2 C) 98.6 F (37 C) 97.5 F (36.4 C)  TempSrc: Oral Oral Oral Oral  Resp: 20 20 18 20   Height:      Weight:      SpO2: 98% 100%      Fundus firm Perineum without swelling.  Lab Results  Component Value Date   WBC 19.1* 11/21/2011   HGB 10.8* 11/21/2011   HCT 32.5* 11/21/2011   MCV 93.9 11/21/2011   PLT 168 11/21/2011    --/--/B POS (09/11 0800)/RI  A/P Post partum day 1.  Routine care.  Expect d/c tomorrow.    Mical Brun A

## 2011-11-22 ENCOUNTER — Encounter (HOSPITAL_COMMUNITY): Payer: Self-pay | Admitting: Anesthesiology

## 2011-11-22 ENCOUNTER — Inpatient Hospital Stay (HOSPITAL_COMMUNITY): Payer: Medicaid Other | Admitting: Anesthesiology

## 2011-11-22 ENCOUNTER — Inpatient Hospital Stay (HOSPITAL_COMMUNITY): Admission: RE | Admit: 2011-11-22 | Payer: Medicaid Other | Source: Ambulatory Visit

## 2011-11-22 ENCOUNTER — Encounter (HOSPITAL_COMMUNITY)
Admission: AD | Disposition: A | Discharge status: Home / Self Care | Payer: Self-pay | Source: Ambulatory Visit | Attending: Obstetrics and Gynecology

## 2011-11-22 HISTORY — PX: TUBAL LIGATION: SHX77

## 2011-11-22 SURGERY — LIGATION, FALLOPIAN TUBE, POSTPARTUM
Anesthesia: Spinal | Laterality: Bilateral | Wound class: Clean Contaminated

## 2011-11-22 MED ORDER — BISACODYL 10 MG RE SUPP: 10.0000 mg | Freq: Every day | RECTAL | Status: DC | PRN

## 2011-11-22 MED ORDER — MIDAZOLAM HCL 5 MG/5ML IJ SOLN
INTRAMUSCULAR | Status: DC | PRN
  Administered 2011-11-22 (×2): 1 mg via INTRAVENOUS

## 2011-11-22 MED ORDER — ONDANSETRON HCL 4 MG/2ML IJ SOLN
INTRAMUSCULAR | Status: AC
  Filled 2011-11-22: qty 2

## 2011-11-22 MED ORDER — MUPIROCIN 2 % EX OINT
1.0000 "application " | TOPICAL_OINTMENT | Freq: Two times a day (BID) | CUTANEOUS | Status: DC
  Administered 2011-11-22 – 2011-11-23 (×3): 1 via NASAL
  Filled 2011-11-22: qty 22

## 2011-11-22 MED ORDER — PRENATAL MULTIVITAMIN CH
1.0000 | ORAL_TABLET | Freq: Every day | ORAL | Status: DC
  Administered 2011-11-22 – 2011-11-23 (×2): 1 via ORAL
  Filled 2011-11-22: qty 1

## 2011-11-22 MED ORDER — SODIUM BICARBONATE 8.4 % IV SOLN
INTRAVENOUS | Status: AC
  Filled 2011-11-22: qty 50

## 2011-11-22 MED ORDER — SENNOSIDES-DOCUSATE SODIUM 8.6-50 MG PO TABS: 2.0000 | ORAL_TABLET | Freq: Every day | ORAL | Status: DC

## 2011-11-22 MED ORDER — ZOLPIDEM TARTRATE 5 MG PO TABS: 5.0000 mg | ORAL_TABLET | Freq: Every evening | ORAL | Status: DC | PRN

## 2011-11-22 MED ORDER — ONDANSETRON HCL 4 MG PO TABS: 4.0000 mg | ORAL_TABLET | ORAL | Status: DC | PRN

## 2011-11-22 MED ORDER — LIDOCAINE HCL (CARDIAC) 20 MG/ML IV SOLN
INTRAVENOUS | Status: AC
  Filled 2011-11-22: qty 5

## 2011-11-22 MED ORDER — MIDAZOLAM HCL 2 MG/2ML IJ SOLN
INTRAMUSCULAR | Status: AC
  Filled 2011-11-22: qty 2

## 2011-11-22 MED ORDER — TETANUS-DIPHTH-ACELL PERTUSSIS 5-2.5-18.5 LF-MCG/0.5 IM SUSP: 0.5000 mL | Freq: Once | INTRAMUSCULAR | Status: DC

## 2011-11-22 MED ORDER — LACTATED RINGERS IV SOLN: INTRAVENOUS | Status: DC

## 2011-11-22 MED ORDER — FENTANYL CITRATE 0.05 MG/ML IJ SOLN
INTRAMUSCULAR | Status: AC
  Filled 2011-11-22: qty 2

## 2011-11-22 MED ORDER — SIMETHICONE 80 MG PO CHEW
80.0000 mg | CHEWABLE_TABLET | Freq: Three times a day (TID) | ORAL | Status: DC
  Administered 2011-11-22 – 2011-11-23 (×2): 80 mg via ORAL

## 2011-11-22 MED ORDER — MEASLES, MUMPS & RUBELLA VAC ~~LOC~~ INJ: 0.5000 mL | INJECTION | Freq: Once | SUBCUTANEOUS | Status: DC

## 2011-11-22 MED ORDER — BUPIVACAINE IN DEXTROSE 0.75-8.25 % IT SOLN
INTRATHECAL | Status: DC | PRN
  Administered 2011-11-22: 1.3 mL via INTRATHECAL

## 2011-11-22 MED ORDER — METHYLERGONOVINE MALEATE 0.2 MG PO TABS: 0.2000 mg | ORAL_TABLET | ORAL | Status: DC | PRN

## 2011-11-22 MED ORDER — IBUPROFEN 600 MG PO TABS
600.0000 mg | ORAL_TABLET | Freq: Four times a day (QID) | ORAL | Status: DC
  Administered 2011-11-22 – 2011-11-23 (×4): 600 mg via ORAL
  Filled 2011-11-22 (×4): qty 1

## 2011-11-22 MED ORDER — KETOROLAC TROMETHAMINE 30 MG/ML IJ SOLN
INTRAMUSCULAR | Status: AC
  Filled 2011-11-22: qty 1

## 2011-11-22 MED ORDER — MENTHOL 3 MG MT LOZG: 1.0000 | LOZENGE | OROMUCOSAL | Status: DC | PRN

## 2011-11-22 MED ORDER — METOCLOPRAMIDE HCL 10 MG PO TABS
10.0000 mg | ORAL_TABLET | Freq: Once | ORAL | Status: AC
  Administered 2011-11-22: 10 mg via ORAL
  Filled 2011-11-22: qty 1

## 2011-11-22 MED ORDER — OXYCODONE-ACETAMINOPHEN 5-325 MG PO TABS: 1.0000 | ORAL_TABLET | ORAL | Status: AC | PRN

## 2011-11-22 MED ORDER — FENTANYL CITRATE 0.05 MG/ML IJ SOLN
25.0000 ug | INTRAMUSCULAR | Status: DC | PRN
  Administered 2011-11-22: 50 ug via INTRAVENOUS

## 2011-11-22 MED ORDER — METHYLERGONOVINE MALEATE 0.2 MG/ML IJ SOLN: 0.2000 mg | INTRAMUSCULAR | Status: DC | PRN

## 2011-11-22 MED ORDER — ONDANSETRON HCL 4 MG/2ML IJ SOLN
INTRAMUSCULAR | Status: DC | PRN
  Administered 2011-11-22: 4 mg via INTRAVENOUS

## 2011-11-22 MED ORDER — DIPHENHYDRAMINE HCL 25 MG PO CAPS: 25.0000 mg | ORAL_CAPSULE | Freq: Four times a day (QID) | ORAL | Status: DC | PRN

## 2011-11-22 MED ORDER — WITCH HAZEL-GLYCERIN EX PADS: 1.0000 "application " | MEDICATED_PAD | CUTANEOUS | Status: DC | PRN

## 2011-11-22 MED ORDER — SIMETHICONE 80 MG PO CHEW: 80.0000 mg | CHEWABLE_TABLET | ORAL | Status: DC | PRN

## 2011-11-22 MED ORDER — LACTATED RINGERS IV SOLN
INTRAVENOUS | Status: DC
  Administered 2011-11-22: 07:00:00 via INTRAVENOUS

## 2011-11-22 MED ORDER — OXYCODONE-ACETAMINOPHEN 5-325 MG PO TABS
1.0000 | ORAL_TABLET | ORAL | Status: DC | PRN
  Administered 2011-11-22: 1 via ORAL
  Administered 2011-11-23: 2 via ORAL
  Administered 2011-11-23: 1 via ORAL
  Filled 2011-11-22: qty 1
  Filled 2011-11-22: qty 2
  Filled 2011-11-22 (×2): qty 1

## 2011-11-22 MED ORDER — ONDANSETRON HCL 4 MG/2ML IJ SOLN: 4.0000 mg | INTRAMUSCULAR | Status: DC | PRN

## 2011-11-22 MED ORDER — FERROUS SULFATE 325 (65 FE) MG PO TABS
325.0000 mg | ORAL_TABLET | Freq: Two times a day (BID) | ORAL | Status: DC
  Administered 2011-11-22 – 2011-11-23 (×2): 325 mg via ORAL
  Filled 2011-11-22 (×2): qty 1

## 2011-11-22 MED ORDER — BUPIVACAINE HCL (PF) 0.25 % IJ SOLN
INTRAMUSCULAR | Status: DC | PRN
  Administered 2011-11-22: 8 mL

## 2011-11-22 MED ORDER — DIBUCAINE 1 % RE OINT: 1.0000 "application " | TOPICAL_OINTMENT | RECTAL | Status: DC | PRN

## 2011-11-22 MED ORDER — FLEET ENEMA 7-19 GM/118ML RE ENEM: 1.0000 | ENEMA | Freq: Every day | RECTAL | Status: DC | PRN

## 2011-11-22 MED ORDER — FAMOTIDINE 20 MG PO TABS
40.0000 mg | ORAL_TABLET | Freq: Once | ORAL | Status: AC
  Administered 2011-11-22: 40 mg via ORAL
  Filled 2011-11-22: qty 1

## 2011-11-22 MED ORDER — BUPIVACAINE-EPINEPHRINE PF 0.25-1:200000 % IJ SOLN
INTRAMUSCULAR | Status: AC
  Filled 2011-11-22: qty 30

## 2011-11-22 MED ORDER — CHLORHEXIDINE GLUCONATE CLOTH 2 % EX PADS
6.0000 | MEDICATED_PAD | Freq: Every day | CUTANEOUS | Status: DC
  Administered 2011-11-22 – 2011-11-23 (×2): 6 via TOPICAL

## 2011-11-22 MED ORDER — FENTANYL CITRATE 0.05 MG/ML IJ SOLN
INTRAMUSCULAR | Status: DC | PRN
  Administered 2011-11-22: 25 ug via INTRAVENOUS
  Administered 2011-11-22: 75 ug via INTRAVENOUS

## 2011-11-22 MED ORDER — LANOLIN HYDROUS EX OINT: 1.0000 "application " | TOPICAL_OINTMENT | CUTANEOUS | Status: DC | PRN

## 2011-11-22 MED ORDER — KETOROLAC TROMETHAMINE 30 MG/ML IJ SOLN
INTRAMUSCULAR | Status: DC | PRN
  Administered 2011-11-22: 30 mg via INTRAVENOUS

## 2011-11-22 SURGICAL SUPPLY — 22 items
CHLORAPREP W/TINT 26ML (MISCELLANEOUS) ×2 IMPLANT
CLIP FILSHIE TUBAL LIGA STRL (Clip) ×4 IMPLANT
CLOTH BEACON ORANGE TIMEOUT ST (SAFETY) ×2 IMPLANT
CONTAINER PREFILL 10% NBF 15ML (MISCELLANEOUS) ×4 IMPLANT
DERMABOND ADVANCED (GAUZE/BANDAGES/DRESSINGS) ×1
DERMABOND ADVANCED .7 DNX12 (GAUZE/BANDAGES/DRESSINGS) ×1 IMPLANT
ELECT REM PT RETURN 9FT ADLT (ELECTROSURGICAL) ×2
ELECTRODE REM PT RTRN 9FT ADLT (ELECTROSURGICAL) ×1 IMPLANT
GLOVE BIO SURGEON STRL SZ7 (GLOVE) ×4 IMPLANT
GOWN PREVENTION PLUS LG XLONG (DISPOSABLE) ×4 IMPLANT
NEEDLE HYPO 25X1 1.5 SAFETY (NEEDLE) ×2 IMPLANT
NS IRRIG 1000ML POUR BTL (IV SOLUTION) ×2 IMPLANT
PACK ABDOMINAL MINOR (CUSTOM PROCEDURE TRAY) ×2 IMPLANT
PENCIL BUTTON HOLSTER BLD 10FT (ELECTRODE) ×2 IMPLANT
SPONGE LAP 4X18 X RAY DECT (DISPOSABLE) IMPLANT
SUT PLAIN 0 NONE (SUTURE) ×2 IMPLANT
SUT VIC AB 2-0 UR5 27 (SUTURE) ×2 IMPLANT
SUT VICRYL 4-0 PS2 18IN ABS (SUTURE) IMPLANT
SYR CONTROL 10ML LL (SYRINGE) ×2 IMPLANT
TOWEL OR 17X24 6PK STRL BLUE (TOWEL DISPOSABLE) ×4 IMPLANT
TRAY FOLEY CATH 14FR (SET/KITS/TRAYS/PACK) ×2 IMPLANT
WATER STERILE IRR 1000ML POUR (IV SOLUTION) ×2 IMPLANT

## 2011-11-22 NOTE — Progress Notes (Signed)
Except for having had a successful vaginal delivery, there has been no change in the patients history, status or exam since the history and physical.  Filed Vitals:   11/21/11 1226 11/21/11 1500 11/21/11 2240 11/22/11 0630  BP: 100/60 85/54 103/66 99/64  Pulse: 80 87 83 70  Temp: 97.6 F (36.4 C) 98.2 F (36.8 C) 98.2 F (36.8 C) 97.9 F (36.6 C)  TempSrc: Oral Oral Oral Oral  Resp: 18 16 18 18   Height:      Weight:      SpO2:        Lab Results  Component Value Date   WBC 19.1* 11/21/2011   HGB 10.8* 11/21/2011   HCT 32.5* 11/21/2011   MCV 93.9 11/21/2011   PLT 168 11/21/2011   The pt verbalized again in english her understanding of the risks and benefits of BTL and her desire for permanent sterilization. Brookelynn Hamor A

## 2011-11-22 NOTE — Anesthesia Procedure Notes (Signed)
Spinal  Patient location during procedure: OR Start time: 11/22/2011 7:33 AM Staffing Performed by: anesthesiologist  Preanesthetic Checklist Completed: patient identified, site marked, surgical consent, pre-op evaluation, timeout performed, IV checked, risks and benefits discussed and monitors and equipment checked Spinal Block Patient position: sitting Prep: site prepped and draped and DuraPrep Patient monitoring: heart rate, cardiac monitor, continuous pulse ox and blood pressure Approach: midline Location: L3-4 Injection technique: single-shot Needle Needle type: Sprotte  Needle gauge: 24 G Needle length: 9 cm Assessment Sensory level: T4 Additional Notes Clear free flow CSF on second attempt.  Difficult placement due to patient movement during procedure.  No paresthesia.  Jasmine December, MD

## 2011-11-22 NOTE — Discharge Summary (Addendum)
Obstetric Discharge Summary Reason for Admission: onset of labor Prenatal Procedures: none Intrapartum Procedures: VE delivery Postpartum Procedures: P.P. tubal ligation Complications-Operative and Postpartum: 1st degree perineal Hemoglobin  Date Value Range Status  11/21/2011 10.8* 12.0 - 15.0 g/dL Final     HCT  Date Value Range Status  11/21/2011 32.5* 36.0 - 46.0 % Final    Discharge Diagnoses: Term Pregnancy-delivered  Discharge Information: Date: 11/22/2011 Activity: pelvic rest Diet: routine Medications: Percocet Condition: stable Instructions: refer to practice specific booklet Discharge to: home   Newborn Data: Live born female  Birth Weight: 7 lb 13.9 oz (3569 g) APGAR: 8, 9  Home with mother.  Kamoni Depree A 11/22/2011, 8:19 AM

## 2011-11-22 NOTE — Anesthesia Postprocedure Evaluation (Signed)
  Anesthesia Post-op Note  Patient: Jessica Perry  Procedure(s) Performed: Procedure(s) (LRB) with comments: POST PARTUM TUBAL LIGATION (Bilateral)  Patient Location: PACU  Anesthesia Type: Spinal  Level of Consciousness: awake, alert  and oriented  Airway and Oxygen Therapy: Patient Spontanous Breathing  Post-op Pain: none  Post-op Assessment: Post-op Vital signs reviewed, Patient's Cardiovascular Status Stable, Respiratory Function Stable, Patent Airway, No signs of Nausea or vomiting, Pain level controlled, No headache and No backache  Post-op Vital Signs: Reviewed and stable  Complications: No apparent anesthesia complications

## 2011-11-22 NOTE — Progress Notes (Signed)
Pt uncooperative for IV attempt R inner wrist, first stating tourniquet "too tight" and then tourniquet "on too long", then does "not want it in hand", pointing to antecubital space.  These statements were said to husband because pt speaking to husband in different language, not to me. I asked pt to talk to me because I can not tell what is wrong. She said she wants someone else to start her IV, and I asked why, she will not answer. I told her that someone else will start her IV before surgery.

## 2011-11-22 NOTE — Op Note (Signed)
11/20/2011 - 11/22/2011  8:06 AM  PATIENT:  Jessica Perry  31 y.o. female  PRE-OPERATIVE DIAGNOSIS:  Desires Sterilization  POST-OPERATIVE DIAGNOSIS:  Desires Sterilization  PROCEDURE:  Procedure(s) (LRB) with comments: POST PARTUM TUBAL LIGATION (Bilateral)  SURGEON:  Surgeon(s) and Role:    * Rolen Conger A Cynthia Cogle, MD - Primary   ANESTHESIA:   spinal  EBL:  Total I/O In: 500 [I.V.:500] Out: 620 [Urine:600; Blood:20]   LOCAL MEDICATIONS USED:  MARCAINE     SPECIMEN:  No Specimen  DISPOSITION OF SPECIMEN:  N/A  COUNTS:  YES  DICTATION: .Note written in EPIC  PLAN OF CARE: Admit to inpatient   PATIENT DISPOSITION:  PACU - hemodynamically stable.   Delay start of Pharmacological VTE agent (>24hrs) due to surgical blood loss or risk of bleeding: not applicable  Findings: normal tubes bilaterally, ovaries palpated normal.  Complications: none.  Prior to the surgery the patient was counseled that the procedure was permanent and all risks, benefits, and alternatives had been discussed.  After adequate spinal anesthesia was achieved, 0.25% Marcaine was injected infraumbilically. A 2 cm skin incision was made with the scalpel and carried down layer by layer with the aid of two allises.  At the level of the peritoneum, it was tented up and incised carefully with the scalper. Then retractors were used to identify each tube in succession and each was tented up with two babcocks and followed to it's fimbriated end.  Two filschie clips were placed on the isthmic portion of each tube.  The clips were confirmed to occlude the entire circumference of each tube. Each tube was then placed back intra-abdominally and the ovaries palpated normal.  The fachial incision was closed with 2-0 vicryl in a running stitch with the aid of kochers.  The skin was closed with dermabond.  

## 2011-11-22 NOTE — Anesthesia Preprocedure Evaluation (Signed)
Anesthesia Evaluation  Patient identified by MRN, date of birth, ID band Patient awake    Reviewed: Allergy & Precautions, H&P , NPO status , Patient's Chart, lab work & pertinent test results, reviewed documented beta blocker date and time   History of Anesthesia Complications Negative for: history of anesthetic complications  Airway Mallampati: III TM Distance: >3 FB Neck ROM: full    Dental  (+) Teeth Intact   Pulmonary neg pulmonary ROS,  breath sounds clear to auscultation        Cardiovascular negative cardio ROS  Rhythm:regular Rate:Normal     Neuro/Psych negative neurological ROS  negative psych ROS   GI/Hepatic negative GI ROS, Neg liver ROS,   Endo/Other  negative endocrine ROSneg diabetes (GDM prior pregnancy)  Renal/GU negative Renal ROS  negative genitourinary   Musculoskeletal   Abdominal   Peds  Hematology negative hematology ROS (+)   Anesthesia Other Findings   Reproductive/Obstetrics (+) Pregnancy                           Anesthesia Physical  Anesthesia Plan  ASA: II  Anesthesia Plan: Spinal   Post-op Pain Management:    Induction:   Airway Management Planned:   Additional Equipment:   Intra-op Plan:   Post-operative Plan:   Informed Consent: I have reviewed the patients History and Physical, chart, labs and discussed the procedure including the risks, benefits and alternatives for the proposed anesthesia with the patient or authorized representative who has indicated his/her understanding and acceptance.     Plan Discussed with: Surgeon and CRNA  Anesthesia Plan Comments:         Anesthesia Quick Evaluation

## 2011-11-22 NOTE — Transfer of Care (Signed)
Immediate Anesthesia Transfer of Care Note  Patient: Jessica Perry  Procedure(s) Performed: Procedure(s) (LRB) with comments: POST PARTUM TUBAL LIGATION (Bilateral)  Patient Location: PACU  Anesthesia Type: Spinal  Level of Consciousness: oriented and sedated  Airway & Oxygen Therapy: Patient Spontanous Breathing  Post-op Assessment: Report given to PACU RN and Post -op Vital signs reviewed and stable  Post vital signs: Reviewed and stable  Complications: No apparent anesthesia complications

## 2011-11-22 NOTE — Brief Op Note (Signed)
11/20/2011 - 11/22/2011  8:06 AM  PATIENT:  Jessica Perry  31 y.o. female  PRE-OPERATIVE DIAGNOSIS:  Desires Sterilization  POST-OPERATIVE DIAGNOSIS:  Desires Sterilization  PROCEDURE:  Procedure(s) (LRB) with comments: POST PARTUM TUBAL LIGATION (Bilateral)  SURGEON:  Surgeon(s) and Role:    * Loney Laurence, MD - Primary   ANESTHESIA:   spinal  EBL:  Total I/O In: 500 [I.V.:500] Out: 620 [Urine:600; Blood:20]   LOCAL MEDICATIONS USED:  MARCAINE     SPECIMEN:  No Specimen  DISPOSITION OF SPECIMEN:  N/A  COUNTS:  YES  DICTATION: .Note written in EPIC  PLAN OF CARE: Admit to inpatient   PATIENT DISPOSITION:  PACU - hemodynamically stable.   Delay start of Pharmacological VTE agent (>24hrs) due to surgical blood loss or risk of bleeding: not applicable  Findings: normal tubes bilaterally, ovaries palpated normal.  Complications: none.  Prior to the surgery the patient was counseled that the procedure was permanent and all risks, benefits, and alternatives had been discussed.  After adequate spinal anesthesia was achieved, 0.25% Marcaine was injected infraumbilically. A 2 cm skin incision was made with the scalpel and carried down layer by layer with the aid of two allises.  At the level of the peritoneum, it was tented up and incised carefully with the scalper. Then retractors were used to identify each tube in succession and each was tented up with two babcocks and followed to it's fimbriated end.  Two filschie clips were placed on the isthmic portion of each tube.  The clips were confirmed to occlude the entire circumference of each tube. Each tube was then placed back intra-abdominally and the ovaries palpated normal.  The fachial incision was closed with 2-0 vicryl in a running stitch with the aid of kochers.  The skin was closed with dermabond.

## 2011-11-23 MED ORDER — OXYCODONE-ACETAMINOPHEN 5-325 MG PO TABS: 1.0000 | ORAL_TABLET | ORAL | Status: AC | PRN

## 2011-11-25 ENCOUNTER — Encounter (HOSPITAL_COMMUNITY): Payer: Self-pay | Admitting: Obstetrics and Gynecology

## 2012-09-06 ENCOUNTER — Ambulatory Visit: Payer: BC Managed Care – PPO

## 2012-09-06 ENCOUNTER — Ambulatory Visit (INDEPENDENT_AMBULATORY_CARE_PROVIDER_SITE_OTHER): Payer: BC Managed Care – PPO | Admitting: Family Medicine

## 2012-09-06 VITALS — BP 96/62 | HR 64 | Temp 97.5°F | Resp 18 | Ht 63.5 in | Wt 117.4 lb

## 2012-09-06 DIAGNOSIS — Z609 Problem related to social environment, unspecified: Secondary | ICD-10-CM

## 2012-09-06 DIAGNOSIS — J309 Allergic rhinitis, unspecified: Secondary | ICD-10-CM

## 2012-09-06 DIAGNOSIS — Z603 Acculturation difficulty: Secondary | ICD-10-CM

## 2012-09-06 DIAGNOSIS — S335XXA Sprain of ligaments of lumbar spine, initial encounter: Secondary | ICD-10-CM

## 2012-09-06 DIAGNOSIS — J302 Other seasonal allergic rhinitis: Secondary | ICD-10-CM

## 2012-09-06 DIAGNOSIS — M549 Dorsalgia, unspecified: Secondary | ICD-10-CM

## 2012-09-06 DIAGNOSIS — S39012A Strain of muscle, fascia and tendon of lower back, initial encounter: Secondary | ICD-10-CM

## 2012-09-06 MED ORDER — MELOXICAM 7.5 MG PO TABS: 7.5000 mg | ORAL_TABLET | Freq: Every day | ORAL | Status: DC

## 2012-09-06 MED ORDER — CYCLOBENZAPRINE HCL 5 MG PO TABS: 5.0000 mg | ORAL_TABLET | Freq: Every day | ORAL | Status: DC

## 2012-09-06 MED ORDER — TRIAMCINOLONE ACETONIDE(NASAL) 55 MCG/ACT NA INHA: 2.0000 | Freq: Every day | NASAL | Status: DC

## 2012-09-06 NOTE — Progress Notes (Signed)
   [address] [place] [address]   Phone [phone]  Subjective:    Patient ID: Jessica Perry, female    DOB: 1981-02-23, 32 y.o.   MRN: [id number]  HPI Pt presents to clinic with 4 year h/o back pain that is daily, worse with sitting and getting up in the am - stiff and burning pain that does not radiate into legs.  It is low mid back.  She has done nothing for it.  She states that the pain started after the birth of her 1st son who is 4 and she also has a 34 month old son.  She had vaginal births with both and epidurals.  She did seek medical care about 2 years ago but I am unsure what they did or how she responded to the treatment.  She also is having daily sinus congestion with a lot of clearing her throat and sneezing.  They sleep on a thin mattress on the floor.  She does not pick up her 31 y/o often but she does state that he holds her other son on her R hip a lot because he does not like to be left on the floor.  She is a stay at home mom.  Review of Systems  HENT: Positive for congestion, rhinorrhea (clear) and sneezing.   Musculoskeletal: Positive for back pain. Negative for gait problem.       Objective:   Physical Exam  Vitals reviewed. Constitutional: She is oriented to person, place, and time. She appears well-developed and well-nourished.  HENT:  Head: Normocephalic and atraumatic.  Right Ear: Hearing, tympanic membrane, external ear and ear canal normal.  Left Ear: Hearing, tympanic membrane, external ear and ear canal normal.  Nose: Mucosal edema (pale and very swollen) present.  Mouth/Throat: Uvula is midline, oropharynx is clear and moist and mucous membranes are normal.  Eyes: Conjunctivae are normal.  Pulmonary/Chest: Effort normal.  Musculoskeletal:       Lumbar back: She exhibits normal range of motion, no tenderness, no bony tenderness, no pain and no spasm.  Neurological: She is alert and oriented to person, place, and time.  Skin: Skin is warm and dry.    Psychiatric: She has a normal mood and affect. Her behavior is normal. Judgment and thought content normal.    [facility] reading (PRIMARY) by  Dr. Katrinka Blazing.  Good disc space, slight decrease in lordosis, mild constipation.      Assessment & Plan:  Back pain without radiation -Pt has 2 small children and he pain started with their births and I do not think related to the actually birth experience but related to low abd muscle tone and weakened muscles from pregnancy and lifting and carrying children.  We will start with conservative measures of NSAIDs and muscle relaxers at night and heat and PT to hope to improve muscle stability of her core and decrease her pain.  Plan: DG Lumbar Spine Complete  Strain of lumbar paraspinal muscle, initial encounter - Plan: triamcinolone (NASACORT) 55 MCG/ACT nasal inhaler, cyclobenzaprine (FLEXERIL) 5 MG tablet, meloxicam (MOBIC) 7.5 MG tablet, Ambulatory referral to Physical Therapy  Allergies - will give her allergy medications to help with her chronic congestion.  F/u if problems.  Benny Lennert PA-C 09/06/2012 12:17 PM

## 2012-09-22 ENCOUNTER — Telehealth: Payer: Self-pay

## 2012-10-14 NOTE — Progress Notes (Signed)
History and physical exam reviewed in detail with Benny Lennert, PA-C. Agree with assessment and plan.

## 2014-05-02 ENCOUNTER — Ambulatory Visit (INDEPENDENT_AMBULATORY_CARE_PROVIDER_SITE_OTHER): Payer: 59 | Admitting: Internal Medicine

## 2014-05-02 VITALS — BP 120/64 | HR 75 | Temp 97.3°F | Resp 19 | Ht 64.5 in | Wt 116.4 lb

## 2014-05-02 DIAGNOSIS — N760 Acute vaginitis: Secondary | ICD-10-CM

## 2014-05-02 DIAGNOSIS — I889 Nonspecific lymphadenitis, unspecified: Secondary | ICD-10-CM

## 2014-05-02 LAB — POCT WET PREP WITH KOH
KOH PREP POC: NEGATIVE
TRICHOMONAS UA: NEGATIVE
YEAST WET PREP PER HPF POC: NEGATIVE

## 2014-05-02 MED ORDER — DOXYCYCLINE MONOHYDRATE 100 MG PO CAPS: 100.0000 mg | ORAL_CAPSULE | Freq: Two times a day (BID) | ORAL | Status: DC

## 2014-05-02 MED ORDER — METRONIDAZOLE 500 MG PO TABS: 500.0000 mg | ORAL_TABLET | Freq: Two times a day (BID) | ORAL | Status: DC

## 2014-05-02 NOTE — Progress Notes (Signed)
   Subjective:    Patient ID: Jessica Perry, female    DOB: [birth year], 34 y.o.   MRN: [id number]  HPI CO tender lump left cervical posterior neck. No rashes or salp lesions known. Also co vaginal rash with no vaginal pain or lesions. No fever, ha, fatigue, sore throat. Speaks no english, husband interprets.   Review of Systems     Objective:   Physical Exam  Constitutional: She is oriented to person, place, and time. She appears well-developed and well-nourished. No distress.  HENT:  Head: Normocephalic.    Right Ear: External ear normal.  Left Ear: External ear normal.  Nose: Nose normal.  Mouth/Throat: Oropharynx is clear and moist.  single small mobile node left posteripor cervical  Eyes: Conjunctivae and EOM are normal. Pupils are equal, round, and reactive to light.  Neck: Normal range of motion. No thyromegaly present.  Pulmonary/Chest: Effort normal.  Lymphadenopathy:    She has cervical adenopathy.  Neurological: She is alert and oriented to person, place, and time. She exhibits normal muscle tone. Coordination normal.  Psychiatric: She has a normal mood and affect.    Wet prep Results for orders placed or performed in visit on 05/02/14  POCT Wet Prep with KOH  Result Value Ref Range   Trichomonas, UA Negative    Clue Cells Wet Prep HPF POC 50%    Epithelial Wet Prep HPF POC 10-15    Yeast Wet Prep HPF POC neg    Bacteria Wet Prep HPF POC Moderate    RBC Wet Prep HPF POC 2-3    WBC Wet Prep HPF POC 10-15    KOH Prep POC Negative    [facility]     Assessment & Plan:  Cervical adenopathy Vaginitis bacterial Doxy/Flagyl

## 2014-05-02 NOTE — Patient Instructions (Signed)
Vim m ??o (Vaginitis) Vim m ??o l hi?n t??ng m ??o b? vim. B?nh ny th??ng gy ra b?i s? thay ??i trong cn b?ng bnh th??ng gi?a vi khu?n v n?m men trong m ??o. S? thay ??i cn b?ng ny gy ra s? pht tri?n qu m?c c?a m?t s? vi khu?n ho?c n?m men nh?t ??nh gy vim. C nhi?u lo?i vim m ??o khc nhau, nh?ng nh?ng lo?i ph? bi?n nh?t l:   Nhi?m khu?n m ??o.  Nhi?m n?m men (candida).  Vim m ??o trichomonas. ?y l b?nh nhi?m trng Sarita truy?n qua ???ng tnh d?c (STI).  Vim m ??o do vi rt.  Vim m ??o teo.  Vim m ??o d? ?ng. NGUYN NHN Nguyn nhn ty thu?c vo lo?i vim m ??o. Vim m ??o c th? gy ra b?i:  Vi khu?n (nhi?m khu?n m ??o).  N?m men (nhi?m n?m men).  K sinh trng (vim m ??o trichomonas)  Vi rt (vim m ??o do vi rt).  M?c hocmon th?p (vim m ??o teo). M?c hocmon th?p c th? x?y ra trong khi mang thai, cho con b ho?c sau khi mn kinh.  Ch?t kch thch, ch?ng h?n nh? t?m b?t, b?ng v? sinh c mi th?m v bnh x?t n? (vim m ??o d? ?ng). Cc y?u t? khc c th? thay ??i cn b?ng bnh th??ng c?a n?m men v vi khu?n trong m ??o. Cc y?u t? ny bao g?m:  Thu?c khng sinh.  V? sinh khng t?t.  Mng ch?n, b?t bi?n m ??o, ch?t di?t tinh trng, thu?c ng?a [place] v d?ng c? t? cung (IUD).  Quan h? tnh d?c.  Nhi?m trng.  B?nh ti?u ???ng khng ???c ki?m sot.  H? th?ng mi?n d?ch b? suy y?u. TRI?U CH?NG Tri?u ch?ng c th? khc nhau, ty thu?c vo nguyn nhn gy vim m ??o. Cc tri?u ch?ng ph? bi?n bao g?m:  Ra kh h? b?t th??ng ? m ??o.  D?ch c mu tr?ng, xm ho?c vng v?i nhi?m khu?n m ??o.  D?ch dy, c mu tr?ng v c mi ph mt v?i nhi?m n?m men.  D?ch c b?t v mu vng ho?c xanh v?i trichomonas.  m ??o c mi hi.  Mi tanh v?i nhi?m khu?n m ??o.  Ng?a, ?au ho?c s?ng m ??o.  ?au khi giao h?p.  ?au ho?c rt khi ?i ti?u. ?i khi khng c tri?u ch?ng. ?I?U TR? Ph??ng php ?i?u tr? ty thu?c vo lo?i nhi?m  trng.  Nhi?m khu?n m ??o v trichomonas th??ng ???c ?i?u tr? b?ng cc d?ng kem ho?c thu?c vin khng sinh.  Nhi?m n?m men th??ng ???c ?i?u tr? b?ng thu?c ch?ng n?m, ch?ng h?n nh? kem ho?c thu?c ??n m ??o.  Vim m ??o do vi rt khng c cch ch?a kh?i hon ton, nh?ng tri?u ch?ng c th? ???c ?i?u tr? b?ng thu?c lm gi?m c?m gic kh ch?u. B?n tnh c?a b?n c?ng c?n ???c ?i?u tr?.  Vim m ??o teo c th? ???c ?i?u tr? b?ng kem, thu?c vin, thu?c ??n estrogen ho?c vng m ??o. N?u m ??o b? kh, cc ch?t bi tr?n v kem d??ng ?m c th? gip ??. B?n c th? ???c h??ng d?n trnh x phng th?m, bnh x?t ho?c th?t r?a m ??o.  ?i?u tr? vim m ??o d? ?ng bao g?m b? s? d?ng s?n ph?m gy ra v?n ??. Cc lo?i kem m ??o c th? ???c s? d?ng ?? ?i?u tr? cc tri?u ch?ng. H??NG D?N CH?M  [place] T?I NH  S? d?ng t?t c? thu?c theo ch? d?n c?a chuyn gia ch?m [place] s?c kh?e.  Gi? vng b? ph?n sinh d?c s?ch v kh. Trnh x phng v ch? r?a vng ny b?ng n??c.  Trnh th?t r?a. N c th? lo?i b? cc vi khu?n c l?i trong m ??o.  Khng s? d?ng b?ng v? sinh ho?c c quan h? tnh d?c cho ??n khi vim m ??o ???c ?i?u tr?. S? d?ng mi?ng dn v? sinh trong khi b? vim m ??o.  Lau t? tr??c ra sau. Lm nh? v?y ?? trnh [place] lan vi khu?n t? tr?c trng sang m ??o.  ?? khng kh ??n vng b? ph?n sinh d?c c?a b?n.  M?c ?? lt b?ng s?i bng ?? lm gi?m s? tch t? h?i ?m.  Trnh m?c ?? lt trong khi ng? cho ??n khi h?t vim m ??o.  Trnh qu?n v ?? lt b ch?t ho?c ?? nylon m khng c t?m lt b?ng s?i bng.  C?i b? qu?n o ??t (??c bi?t l qu?n o t?m) cng s?m cng t?t.  S? d?ng cc s?n ph?m nh?, khng c mi th?m. Young Berryrnh s? d?ng cc ch?t kch thch, ch?ng h?n nh?:  Bnh x?t th?m n?.  N??c x? v?i.  Ch?t t?y r?a c mi th?m.  B?ng v? sinh c mi th?m.  X phng th?m ho?c t?m b?t.  Th?c hnh tnh d?c an ton v s? d?ng bao cao su. Jerrilyn CairoBao cao su c th? ng?n ng?a Kaylean vim m ??o trichomonas v vim m ??o do vi  rt. HY ?I KHM N?U:  B?n b? ?au b?ng.  B?n b? s?t ho?c c cc tri?u ch?ng ko di h?n 2-3 ngy.  B?n b? s?t v cc tri?u ch?ng c?a b?n ??t nhin x?u ?i. Document Released: 11/20/2011 Document Revised: 10/28/2012 [facility] Patient Information 2015 [place]Care, MarylandLL[place]. This information is not intended to replace advice given to you by your health care provider. Make sure you discuss any questions you have with your health care provider.

## 2014-06-08 ENCOUNTER — Ambulatory Visit: Payer: 59 | Admitting: Family Medicine

## 2015-07-12 ENCOUNTER — Other Ambulatory Visit: Payer: Self-pay | Admitting: Obstetrics and Gynecology

## 2015-07-13 LAB — CYTOLOGY - PAP

## 2018-01-30 ENCOUNTER — Other Ambulatory Visit: Payer: Self-pay

## 2018-01-30 ENCOUNTER — Ambulatory Visit: Payer: BLUE CROSS/BLUE SHIELD | Admitting: Family Medicine

## 2018-01-30 ENCOUNTER — Encounter: Payer: Self-pay | Admitting: Family Medicine

## 2018-01-30 VITALS — BP 108/70 | HR 79 | Temp 98.0°F | Ht 60.24 in | Wt 121.0 lb

## 2018-01-30 DIAGNOSIS — N898 Other specified noninflammatory disorders of vagina: Secondary | ICD-10-CM | POA: Diagnosis not present

## 2018-01-30 DIAGNOSIS — B9689 Other specified bacterial agents as the cause of diseases classified elsewhere: Secondary | ICD-10-CM

## 2018-01-30 DIAGNOSIS — N926 Irregular menstruation, unspecified: Secondary | ICD-10-CM

## 2018-01-30 DIAGNOSIS — Z124 Encounter for screening for malignant neoplasm of cervix: Secondary | ICD-10-CM

## 2018-01-30 DIAGNOSIS — R300 Dysuria: Secondary | ICD-10-CM

## 2018-01-30 DIAGNOSIS — R109 Unspecified abdominal pain: Secondary | ICD-10-CM

## 2018-01-30 DIAGNOSIS — N760 Acute vaginitis: Secondary | ICD-10-CM

## 2018-01-30 LAB — POCT URINALYSIS DIP (MANUAL ENTRY)
BILIRUBIN UA: NEGATIVE
Glucose, UA: NEGATIVE mg/dL
Ketones, POC UA: NEGATIVE mg/dL
LEUKOCYTES UA: NEGATIVE
NITRITE UA: NEGATIVE
Protein Ur, POC: NEGATIVE mg/dL
Spec Grav, UA: 1.02 (ref 1.010–1.025)
Urobilinogen, UA: 0.2 E.U./dL
pH, UA: 6 (ref 5.0–8.0)

## 2018-01-30 LAB — POC MICROSCOPIC URINALYSIS (UMFC): MUCUS RE: ABSENT

## 2018-01-30 LAB — POCT WET + KOH PREP
Trich by wet prep: ABSENT
YEAST BY KOH: ABSENT
Yeast by wet prep: ABSENT

## 2018-01-30 LAB — POCT URINE PREGNANCY: Preg Test, Ur: NEGATIVE

## 2018-01-30 MED ORDER — METRONIDAZOLE 500 MG PO TABS: 500.0000 mg | ORAL_TABLET | Freq: Two times a day (BID) | ORAL | 0 refills | Status: DC

## 2018-01-30 NOTE — Progress Notes (Signed)
Vaginal exam- Chaperone Present Labia normal bilaterally without skin lesions Urethral meatus normal appearing without erythema Vagina with scant bloody discharge No CMT, ovaries small and not palpable Uterus midline, nontender Pap smear performed as the patient is due Cervical os appears open as expected in a menstruating female

## 2018-01-30 NOTE — Progress Notes (Signed)
Subjective:  By signing my name below, I, Stann Ore, attest that this documentation has been prepared under the direction and in the presence of Meredith Staggers, MD. Electronically Signed: Stann Ore, Scribe. 01/30/2018 , 12:20 PM .  Patient was seen in Room 8 .   Patient ID: Jessica Perry, female    DOB: May 13, 1980, 37 y.o.   MRN: [id number] Chief Complaint  Patient presents with  . white discharge    pt requesting HCG test   . overlapping cycle    finished one cycle on 10/31 and then some spotting on 11/16  . Abdominal Pain   HPI Jessica Perry is a 37 y.o. female  Patient complains of vaginal discharge, menstrual problems and abdominal pain.   Patient's primary language is [place] but was able to communicate in [place] without difficulty.   Abdominal pain Patient complains of lower abdominal pain present for about 2-3 weeks. She denies fever.   Menstrual problem Patient informs usually having normal period once a month. She had a normal period that lasted for about 3-4 days on Oct 31st. Then, she had some spotting 2 weeks after for about 2 days, initially with bright blood then darker color. She's had some burning with urination.   Vaginal discharge She also reports having white vaginal discharge, present for about 2-3 years now. She was seen in the past by another provider in March, Dr. Tanna Savoy at [facility] care. Appears she was diagnosed with bacterial vaginosis in March, and treated with Flagyl 500 mg bid for 1 week. She had some improvement with medication, but discharge returned completing medication.   She is sexually active, unprotected intercourse with same married partner for 10 years. She denies any extramarital partners. She agrees to STI testing today. She has a history of tubal ligation.   There are no active problems to display for this patient.  Past Medical History:  Diagnosis Date  . Gestational diabetes    With first pregnancy  . No significant  active problems    Past Surgical History:  Procedure Laterality Date  . TUBAL LIGATION  11/22/2011   Procedure: POST PARTUM TUBAL LIGATION;  Surgeon: Loney Laurence, MD;  Location: [facility];  Service: Gynecology;  Laterality: Bilateral;   No Known Allergies Prior to Admission medications   Medication Sig Start Date End Date Taking? Authorizing Provider  cyclobenzaprine (FLEXERIL) 5 MG tablet Take 1 tablet (5 mg total) by mouth daily. Patient not taking: Reported on 05/02/2014 09/06/12   Morrell Riddle, PA-C  doxycycline (MONODOX) 100 MG capsule Take 1 capsule (100 mg total) by mouth 2 (two) times daily. 05/02/14   Jonita Albee, MD  meloxicam (MOBIC) 7.5 MG tablet Take 1 tablet (7.5 mg total) by mouth daily. Patient not taking: Reported on 05/02/2014 09/06/12   Morrell Riddle, PA-C  metroNIDAZOLE (FLAGYL) 500 MG tablet Take 1 tablet (500 mg total) by mouth 2 (two) times daily. 05/02/14   Jonita Albee, MD  triamcinolone (NASACORT) 55 MCG/ACT nasal inhaler Place 2 sprays into the nose daily. 2 sprays each nostril daily. Patient not taking: Reported on 05/02/2014 09/06/12   Morrell Riddle, PA-C   Social History   Socioeconomic History  . Marital status: Single    Spouse name: Not on file  . Number of children: Not on file  . Years of education: Not on file  . Highest education level: Not on file  Occupational History  . Not on file  Social Needs  .  Financial resource strain: Not on file  . Food insecurity:    Worry: Not on file    Inability: Not on file  . Transportation needs:    Medical: Not on file    Non-medical: Not on file  Tobacco Use  . Smoking status: Never Smoker  . Smokeless tobacco: Never Used  Substance and Sexual Activity  . Alcohol use: No  . Drug use: No  . Sexual activity: Not on file  Lifestyle  . Physical activity:    Days per week: Not on file    Minutes per session: Not on file  . Stress: Not on file  Relationships  . Social connections:    Talks on  phone: Not on file    Gets together: Not on file    Attends religious service: Not on file    Active member of club or organization: Not on file    Attends meetings of clubs or organizations: Not on file    Relationship status: Not on file  . Intimate partner violence:    Fear of current or ex partner: Not on file    Emotionally abused: Not on file    Physically abused: Not on file    Forced sexual activity: Not on file  Other Topics Concern  . Not on file  Social History Narrative   ** Merged History Encounter **       Review of Systems  Constitutional: Negative for chills, fatigue, fever and unexpected weight change.  Respiratory: Negative for cough.   Gastrointestinal: Positive for abdominal pain. Negative for constipation, diarrhea, nausea and vomiting.  Genitourinary: Positive for menstrual problem and vaginal discharge.  Skin: Negative for rash and wound.  Neurological: Negative for dizziness, weakness and headaches.       Objective:   Physical Exam  Constitutional: She is oriented to person, place, and time. She appears well-developed and well-nourished. No distress.  HENT:  Head: Normocephalic and atraumatic.  Eyes: Pupils are equal, round, and reactive to light. EOM are normal.  Neck: Neck supple.  Cardiovascular: Normal rate.  Pulmonary/Chest: Effort normal. No respiratory distress.  Abdominal: Soft. There is no tenderness. There is no CVA tenderness.  Musculoskeletal: Normal range of motion.  Neurological: She is alert and oriented to person, place, and time.  Skin: Skin is warm and dry.  Psychiatric: She has a normal mood and affect. Her behavior is normal.  Nursing note and vitals reviewed.   Vitals:   01/30/18 1147  BP: 108/70  Pulse: 79  Temp: 98 F (36.7 C)  TempSrc: Oral  SpO2: 97%  Weight: 121 lb (54.9 kg)  Height: 5' 0.24" (1.53 m)   Results for orders placed or performed in visit on 01/30/18  POCT urine pregnancy  Result Value Ref Range    Preg Test, Ur Negative Negative  POCT urinalysis dipstick  Result Value Ref Range   Color, UA yellow yellow   Clarity, UA clear clear   Glucose, UA negative negative mg/dL   Bilirubin, UA negative negative   Ketones, POC UA negative negative mg/dL   Spec Grav, UA 1.610 9.604 - 1.025   Blood, UA small (A) negative   pH, UA 6.0 5.0 - 8.0   Protein Ur, POC negative negative mg/dL   Urobilinogen, UA 0.2 0.2 or 1.0 E.U./dL   Nitrite, UA Negative Negative   Leukocytes, UA Negative Negative  POCT Microscopic Urinalysis ([facility])  Result Value Ref Range   WBC,UR,HPF,POC None None WBC/hpf   RBC,UR,HPF,POC None None  RBC/hpf   Bacteria None None, Too numerous to count   Mucus Absent Absent   Epithelial Cells, UR Per Microscopy Few (A) None, Too numerous to count cells/hpf  POCT Wet + KOH Prep  Result Value Ref Range   Yeast by KOH Absent Absent   Yeast by wet prep Absent Absent   WBC by wet prep Moderate (A) Few   Clue Cells Wet Prep HPF POC Few (A) None   Trich by wet prep Absent Absent   Bacteria Wet Prep HPF POC Moderate (A) Few   Epithelial Cells By [employer] Pref ([facility]) Many (A) None, Few, Too numerous to count   RBC,UR,HPF,POC Few (A) None RBC/hpf       Assessment & Plan:    Jessica Perry is a 37 y.o. female Irregular menses - Plan: POCT urine pregnancy  -Negative hCG.  If persistent irregular menses, advised to follow-up for further testing.  Abdominal pain, unspecified abdominal location - Plan: POCT urine pregnancy, CANCELED: GC/Chlamydia Probe Amp  -Overall reassuring exam including pelvic exam as above.  Appears to be currently on her menses.  Check STI testing, RTC precautions if worsening  Dysuria - Plan: POCT urinalysis dipstick, POCT Microscopic Urinalysis ([facility]) Vaginal discharge - Plan: POCT Wet + KOH Prep, Pap IG, CT/NG NAA, and HPV (high risk), CANCELED: GC/Chlamydia Probe Amp Bacterial vaginosis - Plan: metroNIDAZOLE (FLAGYL) 500 MG tablet  -Possible mild bacterial  vaginosis, start Flagyl after cessation of current menses if still symptomatic.  STI testing performed.  Encounter for screening for cervical cancer  - Plan: Pap IG, CT/NG NAA, and HPV (high risk)    Meds ordered this encounter  Medications  . metroNIDAZOLE (FLAGYL) 500 MG tablet    Sig: Take 1 tablet (500 mg total) by mouth 2 (two) times daily.    Dispense:  14 tablet    Refill:  0   Patient Instructions     Pregnancy test was negative.  You do appear to be currently on menses.  If next menses is irregular, would recommend other testing or evaluation for irregular menses.  I will check chlamydia and gonorrhea testing, but have also prescribed Flagyl for bacterial vaginosis.  You can wait until after current menses has finished and if still having discharge at that time can start medication if needed.  Return to the clinic or go to the nearest emergency room if any of your symptoms worsen or new symptoms occur.   Nhi?m khu?n m ??o Bacterial Vaginosis Nhi?m khu?n m ??o l tnh tr?ng nhi?m trng m ??o xu?t hi?n khi cn b?ng vi khu?n trong m ??o b? ph v?. ?i?u ny l h?u qu? c?a s? pht tri?n qu m?c c?a m?t s? vi khu?n nh?t ??nh. ?y l tnh tr?ng nhi?m trng m ??o th??ng g?p nh?t ? ph? n? trong ?? tu?i 15-44. V nhi?m khu?n m ??o lm t?ng nguy c? m?c STI (b?nh nhi?m trng [place] truy?n qua ???ng tnh d?c), cho nn ?i?u tr? b?nh c th? gip gi?m nguy c? b? nhi?m chlamydia, l?u, herpes v HIV (vi rt gy suy gi?m mi?n d?ch ? ng??i). ?i?u tr? c?ng quan tr?ng ?? ng?n ng?a bi?n ch?ng ? ph? n? mang thai, b?i v tnh tr?ng ny c th? gy sinh s?m (sinh non). Nguyn nhn g gy ra? Nhi?m khu?n m ??o l do s? gia t?ng vi khu?n c h?i v?n th??ng c m?t v?i m?t s? l??ng nh? trong m ??o. Tuy nhin, ng??i ta v?n ch?a bi?t ??y ?? l  do t?i sao tnh tr?ng ny pht sinh. ?i?u g lm t?ng nguy c?? Nh?ng y?u t? sau c th? lm qu v? d? b? tnh tr?ng ny h?n:  Quan h? tnh d?c v?i b?n tnh m?i  ho?c v?i nhi?u b?n tnh.  Quan h? tnh d?c khng c bi?n php b?o v?.  Th?t r?a.  C vng trnh [place] trong t? cung (IUD).  Ht thu?c.  Nghi?n r??u v ma ty.  Dng m?t s? thu?c khng sinh nh?t ??nh.  ?ang mang thai.  Qu v? khng th? b? nhi?m khu?n m ??o t? b? x, gi??ng ng?, b? b?i ho?c ti?p xc v?i nh?ng ?? v?t [employer]. Cc d?u hi?u ho?c tri?u ch?ng l g? Nh?ng tri?u ch?ng c?a tnh tr?ng ny bao g?m:  Kh h? tr?ng ho?c xm. Kh h? c?ng c th? nhi?u n??c ho?c c b?t.  Kh h? c mi nh? mi c, ??c bi?t l sau khi giao h?p ho?c trong khi hnh kinh.  Ng?a trong v xung quanh m ??o.  Nng rt ho?c ?au khi ?i ti?u.  M?t s? ph? n? b? nhi?m khu?n m ??o khng c d?u hi?u ho?c khng c tri?u ch?ng. Ch?n ?on tnh tr?ng ny nh? th? no? Tnh tr?ng ny ???c ch?n ?on d?a vo:  B?nh s? c?a qu v?.  Khm th?c th? m ??o.  Xt nghi?m m?t m?u d?ch ti?t m ??o d??i knh hi?n vi ?? tm l??ng l?n vi khu?n c h?i ho?c cc t? bo b?t th??ng. Chuyn gia ch?m [place] s?c kh?e c?ng c th? s? d?ng t?m bng ho?c m?t que g? nh? ?? l?y m?u.  Tnh tr?ng ny ???c ?i?u tr? nh? th? no? Tnh tr?ng ny ???c ?i?u tr? b?ng thu?c khng sinh. Thu?c khng sinh c th? ? d?ng vin, kem bi m ??o ho?c thu?c ?? ??t trong m ??o (thu?c ??n). N?u tnh tr?ng ny ti pht sau ?i?u tr?, c th? c?n m?t ??t thu?c khng sinh th? hai. Tun th? nh?ng h??ng d?n ny ? nh: Thu?c  Ch? s? d?ng thu?c khng k ??n v thu?c k ??n theo ch? d?n c?a chuyn gia ch?m [place] s?c kh?e.  ??t ho?c s? d?ng khng sinh theo ch? d?n c?a chuyn gia ch?m [place] s?c kh?e. Khng d?ng ??t ho?c d?ng s? d?ng khng sinh ngay c? khi qu v? b?t ??u c?m th?y ?? h?n. H??ng d?n chung  N?u qu v? c b?n tnh n? gi?i, hy b?o h? r?ng qu v? b? nhi?m trng m ??o. C ?y nn ??n g?p chuyn gia ch?m [place] s?c kh?e v ???c ?i?u tr? n?u c tri?u ch?ng. N?u qu v? c b?n tnh nam gi?i th anh ?y khng c?n ?i?u tr?Suzzette Righter qu trnh ?i?u tr?: ? Trnh quan h?  tnh d?c cho ??n khi qu v? ? hon t?t ?i?u tr?. ? Khng th?t r?a. ? [place] ?? u?ng c c?n theo ch? d?n c?a chuyn gia ch?m [place] s?c kh?e. ? Trnh cho con b theo ch? d?n c?a chuyn gia ch?m [place] s?c kh?e.  U?ng ?? n??c v ?? l?ng ?? gi? cho n??c ti?u trong ho?c c mu vng nh?t.  Gi? cho khu v?c xung quanh m ??o v h?u mn c?a qu v? s?ch s?. ? R?a khu v?c ny hng ngy b?ng n??c ?m. ? Lau t? pha tr??c ra pha sau sau khi ?i v? sinh.  Tun th? t?t c? cc l?n khm theo di theo ch? d?n c?a chuyn gia ch?m [place] s?c kh?e. ?  i?u ny c vai tr quan tr?ng. Ng?n ng?a tnh tr?ng ny b?ng cch no?  Khng th?t r?a.  Ch? r?a bn ngoi m ??o b?ng n??c ?m.  S? d?ng bi?n php b?o v? khi quan h? tnh d?c. ?i?u ny bao g?m vi?c s? d?ng bao cao su v mi?ng b?o v? mi?ng.  Gi?i h?n s? l??ng b?n tnh m qu v? c. ?? gip ng?n ng?a nhi?m khu?n m ??o, t?t nh?t l ch? quan h? tnh d?c v?i m?t b?n tnh (m?t v?, m?t ch?ng).  Ch?c ch?n qu v? v b?n tnh c?a mnh ???c xt nghi?m STI.  M?c qu?n lt cotton ho?c c ??ng b?ng cotton.  Trnh m?c qu?n lt v qu?n n?t ch?t, ??c bi?t l trong ma h.  H?n ch? l??ng r??u m qu v? u?ng.  Khng s? d?ng b?t k? s?n ph?m no ch?a nicotine ho?c thu?c l, ch?ng ha?n nh? thu?c l d?ng ht v thu?c l ?i?n t?. N?u qu v? c?n gip ?? ?? cai thu?c, hy h?i chuyn gia ch?m [place] s?c kh?e.  Khng s? d?ng ma ty b?t h?p php. N?i ?? tm thm thng tin:  Trung tm Ki?m sot v Phng ng?a D?ch b?nh: SolutionApps.co.zawww.cdc.gov/std  Hi?p h?i S?c kh?e Sinh s?n [place]? (ASHA): www.ashastd.org  B? Y t? v D?ch v? Nhn sinh [place]?, V?n phng S?c kho? Ph? n?: ConventionalMedicines.siwww.womenshealth.gov/ or http://www.anderson-williamson.info/https://www.womenshealth.gov/a-z-topics/bacterial-vaginosis Hy lin l?c v?i chuyn gia ch?m [place] s?c kh?e n?u:  Cc tri?u ch?ng c?a qu v? khng c?i thi?n, ngay c? sau khi ?i?u tr?[place] Heads.  Qu v? ra kh h? nhi?u h?n ho?c ?au khi ?i ti?u.  Qu v? b? s?t.  Qu v? b? ?au ? b?ng.  Qu v? b? ?au trong lc quan h? tnh  d?c.  Qu v? b? ch?y mu m ??o gi?a cc chu k? kinh. Tm t?t  Nhi?m khu?n m ??o l tnh tr?ng nhi?m trng m ??o xu?t hi?n khi cn b?ng vi khu?n trong m ??o b? ph v?.  V nhi?m khu?n m ??o lm t?ng nguy c? m?c STI (b?nh nhi?m trng [place] truy?n qua ???ng tnh d?c), cho nn ?i?u tr? b?nh c th? gip gi?m nguy c? b? nhi?m chlamydia, l?u, herpes v HIV (vi rt gy suy gi?m mi?n d?ch ? ng??i). ?i?u tr? c?ng quan tr?ng ?? ng?n ng?a bi?n ch?ng ? ph? n? mang thai, b?i v tnh tr?ng ny c th? gy sinh s?m (sinh non).  Tnh tr?ng ny ???c ?i?u tr? b?ng thu?c khng sinh. Thu?c khng sinh c th? ? d?ng vin, kem bi m ??o ho?c thu?c ?? ??t trong m ??o (thu?c ??n). Thng tin ny khng nh?m m?c ?ch thay th? cho l?i khuyn m chuyn gia ch?m [place] s?c kh?e ni v?i qu v?. Hy b?o ??m qu v? ph?i th?o lu?n b?t k? v?n ?? g m qu v? c v?i chuyn gia ch?m  s?c kh?e c?a qu v?. Document Released: 06/19/2015 Document Revised: 07/03/2016 Document Reviewed: 10/07/2012 Elsevier Interactive Patient Education  2018 Elsevier Inc.  Ch?y mu t? cung do r?i lo?n c? n?ng (Dysfunctional Uterine Bleeding) Ch?y mu t? cung do r?i lo?n c? n?ng l ch?y mu b?t th??ng ? t? cung. Ch?y mu t? cung do r?i lo?n c? n?ng bao g?m:  K? kinh nguy?t ??n s?m h?n ho?c mu?n h?n bnh th??ng.  K? kinh nguy?t nh? h?n, n?ng h?n, ho?c c cc c?c mu ?ng.  Ch?y mu gi?a cc chu k? kinh nguy?t.  B? qua m?t ho?c nhi?u k?  kinh nguy?t.  Ch?y mu sau khi giao h?p.  Ch?y mu sau khi mn kinh. H??NG D?N CH?M [place] T?I NH Ch  ??n nh?ng thay ??i v? tri?u ch?ng c?a qu v?. Lm theo nh?ng h??ng d?n sau ?? gip c?i thi?n tnh tr?ng c?a qu v?: ?n  ?n nh?ng th?c ?n cn b?ng. ?n nh?ng th?c ?n giu ch?t s?t, ch?ng h?n nh? gan, th?t, ??ng v?t c v?, rau l xanh v tr?ng.  N?u qu v? b? to bn: ? U?ng th?t nhi?u n??c. ? ?n nh?ng lo?i tri cy v rau c nhi?u n??c v ch?t x?, ch?ng h?n rau bina (spinach), c r?t, qu? mm xi, to v  xoi. Thu?c  Ch? s? d?ng thu?c khng c?n k ??n v thu?c c?n k ??n theo ch? d?n c?a chuyn gia ch?m [place] s?c kh?e.  Khng thay ??i thu?c m khng h?i  ki?n c?a chuyn gia ch?m [place] s?c kh?e.  Aspirin ho?c cc thu?c c ch?a aspirin c th? lm ch?y mu n?ng h?n. Khng dng nh?ng lo?i thu?c ?: ? Trong tu?n tr??c khi ??n k? kinh nguy?t. ? Trong k? kinh nguy?t.  N?u qu v? ???c k vin thu?c s?t, hy dng thu?c theo ch? d?n c?a chuyn gia ch?m [place] s?c kh?e. Vin thu?c s?t gip thay th? l??ng s?t m c? th? qu v? ? b? m?t do tnh tr?ng ny. Ho?t ??ng  N?u qu v? c?n thay b?ng v? sinh ho?c nt b?ng v? sinh nhi?u l?n trong m?i 2 gi?: ? N?m trn gi??ng v k cao chn (nng cao). ? ??t m?t ti ch??m l?nh ln ph?n b?ng d??i c?a qu v?. ? Ngh? ng?i cng nhi?u cng t?t cho ??n khi ch?y mu d?ng l?i ho?c ch?m l?i.  Khng c? g?ng gi?m cn cho ??n khi tnh tr?ng ch?y mu d?ng l?i v n?ng ?? s?t trong mu c?a qu v? tr? l?i bnh th??ng. Nh?ng h??ng d?n khc  Trong hai thng, ghi l?i: ? Khi no chu k? kinh nguy?n c?a qu v? b?t ??u. ? Khi no chu k? kinh nguy?t c?a qu v? k?t thc. ? Khi no vi?c ch?y mu b?t th??ng x?y ra. ? Qu v? nh?n th?y v?n ?? g.  Tun th? t?t c? cc cu?c h?n khm l?i theo ch? d?n c?a chuyn gia ch?m [place] s?c kh?e. ?i?u ny c vai tr quan tr?ng. ?I [facility] N?U:  Qu v? b? chong vng ho?c y?u ?t.  Qu v? b? bu?n nn v nn m?a.  Qu v? khng th? ?n ho?c u?ng m khng b? nn.  Qu v? c?m th?y chng m?t ho?c b? tiu ch?y khi qu v? ?ang dng thu?c.  Qu v? ?ang dng vin thu?c trnh [place] ho?c hoc-mn, v qu v? mu?n thay th? ho?c d?ng s? d?ng chng. NGAY L?P T?C ?I [facility] N?U:  Qu v? b? s?t ho?c ?n l?nh.  Qu v? c?n thay b?ng v? sinh ho?c nt b?ng v? sinh ho?c nt b?ng v? sinh nhi?u l?n trong m?i gi?:  Qu v? ch?y mu n?ng h?n, ho?c dng mu c ch?a cc c?c mu ?ng th??ng xuyn h?n.  Qu v? b? ?au b?ng.  Qu v? b? b?t t?nh.  Qu v? b? pht ban. Thng tin ny  khng nh?m m?c ?ch thay th? cho l?i khuyn m chuyn gia ch?m [place] s?c kh?e ni v?i qu v?. Hy b?o ??m qu v? ph?i th?o lu?n b?t k? v?n ?? g m qu v? c v?i chuyn gia ch?m [place] s?c kh?e  c?a qu v?. Document Released: 02/25/2005 Document Revised: 11/16/2014 Document Reviewed: 05/23/2014 Elsevier Interactive Patient Education  [employer].   If you have lab work done today you will be contacted with your lab results within the next 2 weeks.  If you have not heard from [place] then please contact us. The fastest way to get your results is to register for My Chart.   IF you received an x-ray today, you will receive an invoice from [facility]. Please contact [facility] at [phone] with questions or concerns regarding your invoice.   IF you received labwork today, you will receive an invoice from [place]. Please contact [facility] at [phone] with questions or concerns regarding your invoice.   Our billing staff will not be able to assist you with questions regarding bills from these companies.  You will be contacted with the lab results as soon as they are available. The fastest way to get your results is to activate your My Chart account. Instructions are located on the last page of this paperwork. If you have not heard from [place] regarding the results in 2 weeks, please contact this office.       I personally performed the services described in this documentation, which was scribed in my presence. The recorded information has been reviewed and considered for accuracy and completeness, addended by me as needed, and agree with information above.  Signed,   Meredith Staggers, MD [facility] at [facility].  02/03/18 6:45 PM

## 2018-01-30 NOTE — Patient Instructions (Addendum)
Pregnancy test was negative.  You do appear to be currently on menses.  If next menses is irregular, would recommend other testing or evaluation for irregular menses.  I will check chlamydia and gonorrhea testing, but have also prescribed Flagyl for bacterial vaginosis.  You can wait until after current menses has finished and if still having discharge at that time can start medication if needed.  Return to the clinic or go to the nearest emergency room if any of your symptoms worsen or new symptoms occur.   Nhi?m khu?n m ??o Bacterial Vaginosis Nhi?m khu?n m ??o l tnh tr?ng nhi?m trng m ??o xu?t hi?n khi cn b?ng vi khu?n trong m ??o b? ph v?. ?i?u ny l h?u qu? c?a s? pht tri?n qu m?c c?a m?t s? vi khu?n nh?t ??nh. ?y l tnh tr?ng nhi?m trng m ??o th??ng g?p nh?t ? ph? n? trong ?? tu?i 15-44. V nhi?m khu?n m ??o lm t?ng nguy c? m?c STI (b?nh nhi?m trng Illeana truy?n qua ???ng tnh d?c), cho nn ?i?u tr? b?nh c th? gip gi?m nguy c? b? nhi?m chlamydia, l?u, herpes v HIV (vi rt gy suy gi?m mi?n d?ch ? ng??i). ?i?u tr? c?ng quan tr?ng ?? ng?n ng?a bi?n ch?ng ? ph? n? mang thai, b?i v tnh tr?ng ny c th? gy sinh s?m (sinh non). Nguyn nhn g gy ra? Nhi?m khu?n m ??o l do s? gia t?ng vi khu?n c h?i v?n th??ng c m?t v?i m?t s? l??ng nh? trong m ??o. Tuy nhin, ng??i ta v?n ch?a bi?t ??y ?? l do t?i sao tnh tr?ng ny pht sinh. ?i?u g lm t?ng nguy c?? Nh?ng y?u t? sau c th? lm qu v? d? b? tnh tr?ng ny h?n:  Quan h? tnh d?c v?i b?n tnh m?i ho?c v?i nhi?u b?n tnh.  Quan h? tnh d?c khng c bi?n php b?o v?.  Th?t r?a.  C vng trnh [place] trong t? cung (IUD).  Ht thu?c.  Nghi?n r??u v ma ty.  Dng m?t s? thu?c khng sinh nh?t ??nh.  ?ang mang thai.  Qu v? khng th? b? nhi?m khu?n m ??o t? b? x, gi??ng ng?, b? b?i ho?c ti?p xc v?i nh?ng ?? v?t [employer]. Cc d?u hi?u ho?c tri?u ch?ng l g? Nh?ng tri?u ch?ng c?a tnh tr?ng ny bao  g?m:  Kh h? tr?ng ho?c xm. Kh h? c?ng c th? nhi?u n??c ho?c c b?t.  Kh h? c mi nh? mi c, ??c bi?t l sau khi giao h?p ho?c trong khi hnh kinh.  Ng?a trong v xung quanh m ??o.  Nng rt ho?c ?au khi ?i ti?u.  M?t s? ph? n? b? nhi?m khu?n m ??o khng c d?u hi?u ho?c khng c tri?u ch?ng. Ch?n ?on tnh tr?ng ny nh? th? no? Tnh tr?ng ny ???c ch?n ?on d?a vo:  B?nh s? c?a qu v?.  Khm th?c th? m ??o.  Xt nghi?m m?t m?u d?ch ti?t m ??o d??i knh hi?n vi ?? tm l??ng l?n vi khu?n c h?i ho?c cc t? bo b?t th??ng. Chuyn gia ch?m [place] s?c kh?e c?ng c th? s? d?ng t?m bng ho?c m?t que g? nh? ?? l?y m?u.  Tnh tr?ng ny ???c ?i?u tr? nh? th? no? Tnh tr?ng ny ???c ?i?u tr? b?ng thu?c khng sinh. Thu?c khng sinh c th? ? d?ng vin, kem bi m ??o ho?c thu?c ?? ??t trong m ??o (thu?c ??n). N?u tnh tr?ng ny ti pht sau ?i?u  tr?, c th? c?n m?t ??t thu?c khng sinh th? hai. Tun th? nh?ng h??ng d?n ny ? nh: Thu?c  Ch? s? d?ng thu?c khng k ??n v thu?c k ??n theo ch? d?n c?a chuyn gia ch?m [place] s?c kh?e.  ??t ho?c s? d?ng khng sinh theo ch? d?n c?a chuyn gia ch?m [place] s?c kh?e. Khng d?ng ??t ho?c d?ng s? d?ng khng sinh ngay c? khi qu v? b?t ??u c?m th?y ?? h?n. H??ng d?n chung  N?u qu v? c b?n tnh n? gi?i, hy b?o h? r?ng qu v? b? nhi?m trng m ??o. C ?y nn ??n g?p chuyn gia ch?m [place] s?c kh?e v ???c ?i?u tr? n?u c tri?u ch?ng. N?u qu v? c b?n tnh nam gi?i th anh ?y khng c?n ?i?u tr?Suzzette Righter.  Trong qu trnh ?i?u tr?: ? Trnh quan h? tnh d?c cho ??n khi qu v? ? hon t?t ?i?u tr?. ? Khng th?t r?a. ? [place] ?? u?ng c c?n theo ch? d?n c?a chuyn gia ch?m [place] s?c kh?e. ? Trnh cho con b theo ch? d?n c?a chuyn gia ch?m [place] s?c kh?e.  U?ng ?? n??c v ?? l?ng ?? gi? cho n??c ti?u trong ho?c c mu vng nh?t.  Gi? cho khu v?c xung quanh m ??o v h?u mn c?a qu v? s?ch s?. ? R?a khu v?c ny hng ngy b?ng n??c ?m. ? Lau t? pha tr??c ra pha  sau sau khi ?i v? sinh.  Tun th? t?t c? cc l?n khm theo di theo ch? d?n c?a chuyn gia ch?m [place] s?c kh?e. ?i?u ny c vai tr quan tr?ng. Ng?n ng?a tnh tr?ng ny b?ng cch no?  Khng th?t r?a.  Ch? r?a bn ngoi m ??o b?ng n??c ?m.  S? d?ng bi?n php b?o v? khi quan h? tnh d?c. ?i?u ny bao g?m vi?c s? d?ng bao cao su v mi?ng b?o v? mi?ng.  Gi?i h?n s? l??ng b?n tnh m qu v? c. ?? gip ng?n ng?a nhi?m khu?n m ??o, t?t nh?t l ch? quan h? tnh d?c v?i m?t b?n tnh (m?t v?, m?t ch?ng).  Ch?c ch?n qu v? v b?n tnh c?a mnh ???c xt nghi?m STI.  M?c qu?n lt cotton ho?c c ??ng b?ng cotton.  Trnh m?c qu?n lt v qu?n n?t ch?t, ??c bi?t l trong ma h.  H?n ch? l??ng r??u m qu v? u?ng.  Khng s? d?ng b?t k? s?n ph?m no ch?a nicotine ho?c thu?c l, ch?ng ha?n nh? thu?c l d?ng ht v thu?c l ?i?n t?. N?u qu v? c?n gip ?? ?? cai thu?c, hy h?i chuyn gia ch?m  s?c kh?e.  Khng s? d?ng ma ty b?t h?p php. N?i ?? tm thm thng tin:  Trung tm Ki?m sot v Phng ng?a D?ch b?nh: SolutionApps.co.zawww.cdc.gov/std  Hi?p h?i S?c kh?e Sinh s?n [place]? (ASHA): www.ashastd.org  B? Y t? v D?ch v? Nhn sinh [place] K?, V?n phng S?c kho? Ph? n?: ConventionalMedicines.siwww.womenshealth.gov/ or http://www.anderson-williamson.info/https://www.womenshealth.gov/a-z-topics/bacterial-vaginosis Hy lin l?c v?i chuyn gia ch?m  s?c kh?e n?u:  Cc tri?u ch?ng c?a qu v? khng c?i thi?n, ngay c? sau khi ?i?u tr?Ladell Heads.  Qu v? ra kh h? nhi?u h?n ho?c ?au khi ?i ti?u.  Qu v? b? s?t.  Qu v? b? ?au ? b?ng.  Qu v? b? ?au trong lc quan h? tnh d?c.  Qu v? b? ch?y mu m ??o gi?a cc chu k? kinh. Tm t?t  Nhi?m khu?n m ??o l tnh tr?ng nhi?m trng m ??  o xu?t hi?n khi cn b?ng vi khu?n trong m ??o b? ph v?.  V nhi?m khu?n m ??o lm t?ng nguy c? m?c STI (b?nh nhi?m trng [place] truy?n qua ???ng tnh d?c), cho nn ?i?u tr? b?nh c th? gip gi?m nguy c? b? nhi?m chlamydia, l?u, herpes v HIV (vi rt gy suy gi?m mi?n d?ch ? ng??i). ?i?u tr? c?ng quan  tr?ng ?? ng?n ng?a bi?n ch?ng ? ph? n? mang thai, b?i v tnh tr?ng ny c th? gy sinh s?m (sinh non).  Tnh tr?ng ny ???c ?i?u tr? b?ng thu?c khng sinh. Thu?c khng sinh c th? ? d?ng vin, kem bi m ??o ho?c thu?c ?? ??t trong m ??o (thu?c ??n). Thng tin ny khng nh?m m?c ?ch thay th? cho l?i khuyn m chuyn gia ch?m [place] s?c kh?e ni v?i qu v?. Hy b?o ??m qu v? ph?i th?o lu?n b?t k? v?n ?? g m qu v? c v?i chuyn gia ch?m [place] s?c kh?e c?a qu v?. Document Released: 06/19/2015 Document Revised: 07/03/2016 Document Reviewed: 10/07/2012 Elsevier Interactive Patient Education  2018 Elsevier Inc.  Ch?y mu t? cung do r?i lo?n c? n?ng (Dysfunctional Uterine Bleeding) Ch?y mu t? cung do r?i lo?n c? n?ng l ch?y mu b?t th??ng ? t? cung. Ch?y mu t? cung do r?i lo?n c? n?ng bao g?m:  K? kinh nguy?t ??n s?m h?n ho?c mu?n h?n bnh th??ng.  K? kinh nguy?t nh? h?n, n?ng h?n, ho?c c cc c?c mu ?ng.  Ch?y mu gi?a cc chu k? kinh nguy?t.  B? qua m?t ho?c nhi?u k? kinh nguy?t.  Ch?y mu sau khi giao h?p.  Ch?y mu sau khi mn kinh. H??NG D?N CH?M [place] T?I NH Ch  ??n nh?ng thay ??i v? tri?u ch?ng c?a qu v?. Lm theo nh?ng h??ng d?n sau ?? gip c?i thi?n tnh tr?ng c?a qu v?: ?n  ?n nh?ng th?c ?n cn b?ng. ?n nh?ng th?c ?n giu ch?t s?t, ch?ng h?n nh? gan, th?t, ??ng v?t c v?, rau l xanh v tr?ng.  N?u qu v? b? to bn: ? U?ng th?t nhi?u n??c. ? ?n nh?ng lo?i tri cy v rau c nhi?u n??c v ch?t x?, ch?ng h?n rau bina (spinach), c r?t, qu? mm xi, to v xoi. Thu?c  Ch? s? d?ng thu?c khng c?n k ??n v thu?c c?n k ??n theo ch? d?n c?a chuyn gia ch?m [place] s?c kh?e.  Khng thay ??i thu?c m khng h?i  ki?n c?a chuyn gia ch?m [place] s?c kh?e.  Aspirin ho?c cc thu?c c ch?a aspirin c th? lm ch?y mu n?ng h?n. Khng dng nh?ng lo?i thu?c ?: ? Trong tu?n tr??c khi ??n k? kinh nguy?t. ? Trong k? kinh nguy?t.  N?u qu v? ???c k vin thu?c s?t, hy dng thu?c  theo ch? d?n c?a chuyn gia ch?m  s?c kh?e. Vin thu?c s?t gip thay th? l??ng s?t m c? th? qu v? ? b? m?t do tnh tr?ng ny. Ho?t ??ng  N?u qu v? c?n thay b?ng v? sinh ho?c nt b?ng v? sinh nhi?u l?n trong m?i 2 gi?: ? N?m trn gi??ng v k cao chn (nng cao). ? ??t m?t ti ch??m l?nh ln ph?n b?ng d??i c?a qu v?. ? Ngh? ng?i cng nhi?u cng t?t cho ??n khi ch?y mu d?ng l?i ho?c ch?m l?i.  Khng c? g?ng gi?m cn cho ??n khi tnh tr?ng ch?y mu d?ng l?i v n?ng ?? s?t trong mu c?a qu v? tr? l?i bnh th??ng. Nh?ng  h??ng d?n khc  Trong hai thng, ghi l?i: ? Khi no chu k? kinh nguy?n c?a qu v? b?t ??u. ? Khi no chu k? kinh nguy?t c?a qu v? k?t thc. ? Khi no vi?c ch?y mu b?t th??ng x?y ra. ? Qu v? nh?n th?y v?n ?? g.  Tun th? t?t c? cc cu?c h?n khm l?i theo ch? d?n c?a chuyn gia ch?m [place] s?c kh?e. ?i?u ny c vai tr quan tr?ng. ?I KHM N?U:  Qu v? b? chong vng ho?c y?u ?t.  Qu v? b? bu?n nn v nn m?a.  Qu v? khng th? ?n ho?c u?ng m khng b? nn.  Qu v? c?m th?y chng m?t ho?c b? tiu ch?y khi qu v? ?ang dng thu?c.  Qu v? ?ang dng vin thu?c trnh [place] ho?c hoc-mn, v qu v? mu?n thay th? ho?c d?ng s? d?ng chng. NGAY L?P T?C ?I KHM N?U:  Qu v? b? s?t ho?c ?n l?nh.  Qu v? c?n thay b?ng v? sinh ho?c nt b?ng v? sinh ho?c nt b?ng v? sinh nhi?u l?n trong m?i gi?:  Qu v? ch?y mu n?ng h?n, ho?c dng mu c ch?a cc c?c mu ?ng th??ng xuyn h?n.  Qu v? b? ?au b?ng.  Qu v? b? b?t t?nh.  Qu v? b? pht ban. Thng tin ny khng nh?m m?c ?ch thay th? cho l?i khuyn m chuyn gia ch?m [place] s?c kh?e ni v?i qu v?. Hy b?o ??m qu v? ph?i th?o lu?n b?t k? v?n ?? g m qu v? c v?i chuyn gia ch?m [place] s?c kh?e c?a qu v?. Document Released: 02/25/2005 Document Revised: 11/16/2014 Document Reviewed: 05/23/2014 Elsevier Interactive Patient Education  [employer].   If you have lab work done today you will be contacted with your  lab results within the next 2 weeks.  If you have not heard from [place] then please contact us. The fastest way to get your results is to register for My Chart.   IF you received an x-ray today, you will receive an invoice from [facility]. Please contact [facility] at [phone] with questions or concerns regarding your invoice.   IF you received labwork today, you will receive an invoice from [facility]. Please contact [facility] at [phone] with questions or concerns regarding your invoice.   Our billing staff will not be able to assist you with questions regarding bills from these companies.  You will be contacted with the lab results as soon as they are available. The fastest way to get your results is to activate your My Chart account. Instructions are located on the last page of this paperwork. If you have not heard from [place] regarding the results in 2 weeks, please contact this office.

## 2018-02-03 ENCOUNTER — Encounter: Payer: Self-pay | Admitting: Family Medicine

## 2018-02-03 LAB — PAP IG, CT-NG NAA, HPV HIGH-RISK
Chlamydia, Nuc. Acid Amp: NEGATIVE
GONOCOCCUS BY NUCLEIC ACID AMP: NEGATIVE
HPV, high-risk: NEGATIVE

## 2018-02-06 ENCOUNTER — Encounter: Payer: Self-pay | Admitting: Family Medicine

## 2019-06-12 ENCOUNTER — Encounter (HOSPITAL_COMMUNITY): Payer: Self-pay | Admitting: Emergency Medicine

## 2019-06-12 ENCOUNTER — Emergency Department (HOSPITAL_COMMUNITY)
Admission: EM | Admit: 2019-06-12 | Discharge: 2019-06-12 | Disposition: A | Discharge status: Home / Self Care | Payer: 59 | Attending: Emergency Medicine | Admitting: Emergency Medicine

## 2019-06-12 ENCOUNTER — Other Ambulatory Visit: Payer: Self-pay

## 2019-06-12 ENCOUNTER — Emergency Department (HOSPITAL_COMMUNITY): Payer: 59

## 2019-06-12 DIAGNOSIS — Y929 Unspecified place or not applicable: Secondary | ICD-10-CM | POA: Insufficient documentation

## 2019-06-12 DIAGNOSIS — S99921A Unspecified injury of right foot, initial encounter: Secondary | ICD-10-CM | POA: Diagnosis present

## 2019-06-12 DIAGNOSIS — X509XXA Other and unspecified overexertion or strenuous movements or postures, initial encounter: Secondary | ICD-10-CM | POA: Insufficient documentation

## 2019-06-12 DIAGNOSIS — Y999 Unspecified external cause status: Secondary | ICD-10-CM | POA: Insufficient documentation

## 2019-06-12 DIAGNOSIS — S93601A Unspecified sprain of right foot, initial encounter: Secondary | ICD-10-CM | POA: Diagnosis not present

## 2019-06-12 DIAGNOSIS — Y939 Activity, unspecified: Secondary | ICD-10-CM | POA: Insufficient documentation

## 2019-06-12 MED ORDER — NAPROXEN 500 MG PO TABS: 500.0000 mg | ORAL_TABLET | Freq: Two times a day (BID) | ORAL | 0 refills | Status: DC | PRN

## 2019-06-12 MED ORDER — NAPROXEN 250 MG PO TABS
500.0000 mg | ORAL_TABLET | Freq: Once | ORAL | Status: AC
  Administered 2019-06-12: 500 mg via ORAL
  Filled 2019-06-12: qty 2

## 2019-06-12 NOTE — ED Provider Notes (Signed)
[facility] EMERGENCY DEPARTMENT Provider Note   CSN: [id number] Arrival date & time: 06/12/19  2144     History Chief Complaint  Patient presents with  . Foot Pain    Jessica Perry is a 39 y.o. female.   The history is provided by the patient. No language interpreter was used.  Foot Pain This is a new problem. The current episode started 6 to 12 hours ago. The problem occurs constantly. The problem has not changed since onset.The symptoms are aggravated by walking and standing. Nothing relieves the symptoms. She has tried rest for the symptoms. The treatment provided no relief.       Past Medical History:  Diagnosis Date  . Gestational diabetes    With first pregnancy  . No significant active problems     There are no problems to display for this patient.   Past Surgical History:  Procedure Laterality Date  . TUBAL LIGATION  11/22/2011   Procedure: POST PARTUM TUBAL LIGATION;  Surgeon: Daria Pastures, MD;  Location: [facility];  Service: Gynecology;  Laterality: Bilateral;     OB History    Gravida  3   Para  2   Term  2   Preterm  0   AB  1   Living  2     SAB  1   TAB  0   Ectopic  0   Multiple  0   Live Births  2           Family History  Problem Relation Age of Onset  . Heart disease Maternal Grandmother   . Hypertension Maternal Grandmother   . Diabetes Maternal Grandmother     Social History   Tobacco Use  . Smoking status: Never Smoker  . Smokeless tobacco: Never Used  Substance Use Topics  . Alcohol use: No  . Drug use: No    Home Medications Prior to Admission medications   Medication Sig Start Date End Date Taking? Authorizing Provider  metroNIDAZOLE (FLAGYL) 500 MG tablet Take 1 tablet (500 mg total) by mouth 2 (two) times daily. 01/30/18   Wendie Agreste, MD  naproxen (NAPROSYN) 500 MG tablet Take 1 tablet (500 mg total) by mouth every 12 (twelve) hours as needed for mild pain or moderate pain. 06/12/19    Antonietta Breach, PA-C    Allergies    Patient has no known allergies.  Review of Systems   Review of Systems  Ten systems reviewed and are negative for acute change, except as noted in the HPI.    Physical Exam Updated Vital Signs BP 114/75   Pulse 74   Temp 97.9 F (36.6 C) (Oral)   Resp 16   Ht 5\' 6"  (1.676 m)   Wt 56.7 kg   LMP 05/29/2019 (Exact Date)   SpO2 100%   BMI 20.18 kg/m   Physical Exam Vitals and nursing note reviewed.  Constitutional:      General: She is not in acute distress.    Appearance: She is well-developed. She is not diaphoretic.     Comments: Nontoxic appearing and in NAD  HENT:     Head: Normocephalic and atraumatic.  Eyes:     General: No scleral icterus.    Conjunctiva/sclera: Conjunctivae normal.  Cardiovascular:     Rate and Rhythm: Normal rate and regular rhythm.     Pulses: Normal pulses.     Comments: DP pulse 2+ in the RLE Pulmonary:  Effort: Pulmonary effort is normal. No respiratory distress.     Comments: Respirations even and unlabored Musculoskeletal:        General: Normal range of motion.     Cervical back: Normal range of motion.     Right ankle: Normal.     Right foot: Normal capillary refill. Swelling and tenderness present. No crepitus. Normal pulse.       Feet:  Skin:    General: Skin is warm and dry.     Coloration: Skin is not pale.     Findings: No erythema or rash.  Neurological:     General: No focal deficit present.     Mental Status: She is alert and oriented to person, place, and time.     Coordination: Coordination normal.     Comments: Sensation to light touch intact.  Patient able to wiggle all toes.  Psychiatric:        Behavior: Behavior normal.     ED Results / Procedures / Treatments   Labs (all labs ordered are listed, but only abnormal results are displayed) Labs Reviewed - No data to display  EKG None  Radiology DG Foot Complete Right  Result Date: 06/12/2019 CLINICAL DATA:   Recent fall with dorsal foot pain, initial encounter EXAM: RIGHT FOOT COMPLETE - 3+ VIEW COMPARISON:  None. FINDINGS: There is no evidence of fracture or dislocation. There is no evidence of arthropathy or other focal bone abnormality. Soft tissues are unremarkable. Os naviculare is noted medially. IMPRESSION: No acute abnormality noted. Electronically Signed   By: Alcide Clever M.D.   On: 06/12/2019 22:12    Procedures Procedures (including critical care time)  Medications Ordered in ED Medications  naproxen (NAPROSYN) tablet 500 mg (500 mg Oral Given 06/12/19 2236)    ED Course  I have reviewed the triage vital signs and the nursing notes.  Pertinent labs & imaging results that were available during my care of the patient were reviewed by me and considered in my medical decision making (see chart for details).  Clinical Course as of Jun 12 2239  Sat Jun 12, 2019  2229 Xrays reviewed by myself; negative for fracture   [KH]    Clinical Course User Index [KH] Antony Madura, PA-C   MDM Rules/Calculators/A&P                      Patient presents to the emergency department for evaluation of R foot pain after twisting her foot following a misstep. Patient neurovascularly intact on exam. Imaging negative for fracture, dislocation, bony deformity. No erythema, heat to touch to the affected area; no concern for infectious etiology/cellulitis. Compartments in the affected extremity are soft. Plan for supportive management including RICE and NSAIDs; primary care follow up as needed. Return precautions discussed and provided. Patient discharged in stable condition with no unaddressed concerns.   Final Clinical Impression(s) / ED Diagnoses Final diagnoses:  Sprain of right foot, initial encounter    Rx / DC Orders ED Discharge Orders         Ordered    naproxen (NAPROSYN) 500 MG tablet  Every 12 hours PRN     06/12/19 2228           [place], [place], PA-C 06/12/19 2241    Raeford Razor, MD 06/13/19 2117

## 2019-06-12 NOTE — ED Triage Notes (Signed)
Pt st's she fell earlier today.  C/O pain to top of right foot.  St's unable to walk on it.

## 2019-06-12 NOTE — ED Notes (Signed)
Patient verbalizes understanding of discharge instructions. Opportunity for questioning and answers were provided. Armband removed by staff, pt discharged from ED via wheelchair.  

## 2020-01-22 ENCOUNTER — Ambulatory Visit: Payer: Self-pay | Attending: Internal Medicine

## 2020-01-22 ENCOUNTER — Other Ambulatory Visit: Payer: Self-pay

## 2020-01-22 DIAGNOSIS — Z23 Encounter for immunization: Secondary | ICD-10-CM

## 2020-01-22 NOTE — Progress Notes (Signed)
   Covid-19 Vaccination Clinic  Name:  Jessica Perry    MRN: [id number] DOB: 08-Dec-1980  01/22/2020  Ms. Fear was observed post Covid-19 immunization for 15 minutes without incident. She was provided with Vaccine Information Sheet and instruction to access the V-Safe system.   [name] was instructed to call 911 with any severe reactions post vaccine: Marland Kitchen Difficulty breathing  . Swelling of face and throat  . A fast heartbeat  . A bad rash all over body  . Dizziness and weakness   Immunizations Administered    Name Date Dose VIS Date Route   Pfizer COVID-19 Vaccine 01/22/2020  2:01 PM 0.3 mL 12/29/2019 Intramuscular   Manufacturer: [employer], [employer]   Lot: [id number]   NDC: [address]-6

## 2020-06-25 ENCOUNTER — Emergency Department (HOSPITAL_COMMUNITY)
Admission: EM | Admit: 2020-06-25 | Discharge: 2020-06-25 | Disposition: A | Discharge status: Home / Self Care | Payer: 59 | Attending: Emergency Medicine | Admitting: Emergency Medicine

## 2020-06-25 ENCOUNTER — Emergency Department (HOSPITAL_COMMUNITY): Payer: 59

## 2020-06-25 ENCOUNTER — Encounter (HOSPITAL_COMMUNITY): Payer: Self-pay | Admitting: Emergency Medicine

## 2020-06-25 ENCOUNTER — Other Ambulatory Visit: Payer: Self-pay

## 2020-06-25 DIAGNOSIS — G44209 Tension-type headache, unspecified, not intractable: Secondary | ICD-10-CM

## 2020-06-25 DIAGNOSIS — R42 Dizziness and giddiness: Secondary | ICD-10-CM | POA: Diagnosis present

## 2020-06-25 LAB — CBC WITH DIFFERENTIAL/PLATELET
Abs Immature Granulocytes: 0.22 10*3/uL — ABNORMAL HIGH (ref 0.00–0.07)
Basophils Absolute: 0.1 10*3/uL (ref 0.0–0.1)
Basophils Relative: 1 %
Eosinophils Absolute: 0.6 10*3/uL — ABNORMAL HIGH (ref 0.0–0.5)
Eosinophils Relative: 5 %
HCT: 42.1 % (ref 36.0–46.0)
Hemoglobin: 13.8 g/dL (ref 12.0–15.0)
Immature Granulocytes: 2 %
Lymphocytes Relative: 21 %
Lymphs Abs: 2.8 10*3/uL (ref 0.7–4.0)
MCH: 30.3 pg (ref 26.0–34.0)
MCHC: 32.8 g/dL (ref 30.0–36.0)
MCV: 92.3 fL (ref 80.0–100.0)
Monocytes Absolute: 0.5 10*3/uL (ref 0.1–1.0)
Monocytes Relative: 4 %
Neutro Abs: 8.9 10*3/uL — ABNORMAL HIGH (ref 1.7–7.7)
Neutrophils Relative %: 67 %
Platelets: 316 10*3/uL (ref 150–400)
RBC: 4.56 MIL/uL (ref 3.87–5.11)
RDW: 12.1 % (ref 11.5–15.5)
WBC: 13.1 10*3/uL — ABNORMAL HIGH (ref 4.0–10.5)
nRBC: 0 % (ref 0.0–0.2)

## 2020-06-25 LAB — BASIC METABOLIC PANEL
Anion gap: 7 (ref 5–15)
BUN: 7 mg/dL (ref 6–20)
CO2: 25 mmol/L (ref 22–32)
Calcium: 8.8 mg/dL — ABNORMAL LOW (ref 8.9–10.3)
Chloride: 103 mmol/L (ref 98–111)
Creatinine, Ser: 0.69 mg/dL (ref 0.44–1.00)
GFR, Estimated: >60 mL/min (ref >60)
Glucose, Bld: 155 mg/dL — ABNORMAL HIGH (ref 70–99)
Potassium: 3.8 mmol/L (ref 3.5–5.1)
Sodium: 135 mmol/L (ref 135–145)

## 2020-06-25 LAB — I-STAT BETA HCG BLOOD, ED (MC, WL, AP ONLY): I-stat hCG, quantitative: <5 m[IU]/mL (ref <5)

## 2020-06-25 MED ORDER — DIAZEPAM 5 MG/ML IJ SOLN: 2.5000 mg | Freq: Once | INTRAMUSCULAR | Status: DC

## 2020-06-25 MED ORDER — NAPROXEN 500 MG PO TABS: 500.0000 mg | ORAL_TABLET | Freq: Two times a day (BID) | ORAL | 0 refills | Status: DC

## 2020-06-25 MED ORDER — SODIUM CHLORIDE 0.9 % IV BOLUS
1000.0000 mL | Freq: Once | INTRAVENOUS | Status: AC
  Administered 2020-06-25: 1000 mL via INTRAVENOUS

## 2020-06-25 NOTE — ED Notes (Signed)
All appropriate discharge materials reviewed at length with patient. Time for questions provided. Pt has no other questions at this time and verbalizes understanding of all provided materials.  

## 2020-06-25 NOTE — ED Triage Notes (Signed)
Patient reports persistent dizziness for 10 days , denies head injury , alert and oriented , prescribed with Antivert with no relief.

## 2020-06-25 NOTE — Discharge Instructions (Signed)
Take the medicine as needed to help with your headache. Continue taking the meclizine as needed make sure drinking plenty of fluids as this could be related to vertigo. Follow instructions regarding maneuvers that you can do to help with your symptoms as well. Follow-up with the ENT specialist listed below. Return to the ED for worsening headache, blurry vision, numbness in arms or legs.

## 2020-06-25 NOTE — ED Provider Notes (Signed)
[facility] EMERGENCY DEPARTMENT Provider Note   CSN: [id number] Arrival date & time: 06/25/20  1938     History Chief Complaint  Patient presents with  . Dizziness    Jessica Perry is a 40 y.o. female who presents to ED with a chief complaint of dizziness.  For the past 10 days has been feeling like her head is "swimmy, dizzy."  Symptoms are mostly consistent and denies any worsening with ambulation.  She reports intermittent headache as well.  Denies any vision changes, numbness in arms or legs, vomiting or diarrhea.  She was prescribed prednisone and meclizine when her symptoms began 10 days ago but she states that she continues to have symptoms and was told to come to the ER for CT of her head.  She denies any changes to speech, fever, neck pain or possibility of pregnancy.  HPI     Past Medical History:  Diagnosis Date  . Gestational diabetes    With first pregnancy  . No significant active problems     There are no problems to display for this patient.   Past Surgical History:  Procedure Laterality Date  . TUBAL LIGATION  11/22/2011   Procedure: POST PARTUM TUBAL LIGATION;  Surgeon: Loney Laurence, MD;  Location: [facility];  Service: Gynecology;  Laterality: Bilateral;     OB History    Gravida  3   Para  2   Term  2   Preterm  0   AB  1   Living  2     SAB  1   IAB  0   Ectopic  0   Multiple  0   Live Births  2           Family History  Problem Relation Age of Onset  . Heart disease Maternal Grandmother   . Hypertension Maternal Grandmother   . Diabetes Maternal Grandmother     Social History   Tobacco Use  . Smoking status: Never Smoker  . Smokeless tobacco: Never Used  Substance Use Topics  . Alcohol use: No  . Drug use: No    Home Medications Prior to Admission medications   Medication Sig Start Date End Date Taking? Authorizing Provider  naproxen (NAPROSYN) 500 MG tablet Take 1 tablet (500 mg total) by mouth 2  (two) times daily. 06/25/20  Yes Vestal Crandall, PA-C  metroNIDAZOLE (FLAGYL) 500 MG tablet Take 1 tablet (500 mg total) by mouth 2 (two) times daily. 01/30/18   Shade Flood, MD    Allergies    Patient has no known allergies.  Review of Systems   Review of Systems  Constitutional: Negative for appetite change, chills and fever.  HENT: Negative for ear pain, rhinorrhea, sneezing and sore throat.   Eyes: Negative for photophobia and visual disturbance.  Respiratory: Negative for cough, chest tightness, shortness of breath and wheezing.   Cardiovascular: Negative for chest pain and palpitations.  Gastrointestinal: Negative for abdominal pain, blood in stool, constipation, diarrhea, nausea and vomiting.  Genitourinary: Negative for dysuria, hematuria and urgency.  Musculoskeletal: Negative for myalgias.  Skin: Negative for rash.  Neurological: Positive for dizziness and headaches. Negative for weakness and light-headedness.    Physical Exam Updated Vital Signs BP (!) 120/91 (BP Location: Left Arm)   Pulse 73   Temp 98.4 F (36.9 C) (Oral)   Resp 16   Ht 5\' 6"  (1.676 m)   Wt 62 kg   LMP 06/22/2020   SpO2  99%   BMI 22.06 kg/m   Physical Exam Vitals and nursing note reviewed.  Constitutional:      General: She is not in acute distress.    Appearance: She is well-developed.  HENT:     Head: Normocephalic and atraumatic.     Nose: Nose normal.  Eyes:     General: No scleral icterus.       Right eye: No discharge.        Left eye: No discharge.     Conjunctiva/sclera: Conjunctivae normal.     Pupils: Pupils are equal, round, and reactive to light.  Cardiovascular:     Rate and Rhythm: Normal rate and regular rhythm.     Heart sounds: Normal heart sounds. No murmur heard. No friction rub. No gallop.   Pulmonary:     Effort: Pulmonary effort is normal. No respiratory distress.     Breath sounds: Normal breath sounds.  Abdominal:     General: Bowel sounds are normal.  There is no distension.     Palpations: Abdomen is soft.     Tenderness: There is no abdominal tenderness. There is no guarding.  Musculoskeletal:        General: Normal range of motion.     Cervical back: Normal range of motion and neck supple.  Skin:    General: Skin is warm and dry.     Findings: No rash.  Neurological:     Mental Status: She is alert and oriented to person, place, and time.     Cranial Nerves: No cranial nerve deficit.     Sensory: No sensory deficit.     Motor: No weakness or abnormal muscle tone.     Coordination: Coordination normal.     Comments: Pupils reactive. No facial asymmetry noted. Cranial nerves appear grossly intact. Sensation intact to light touch on face, BUE and BLE. Strength 5/5 in BUE and BLE. Normal finger to nose coordination bilaterally.     ED Results / Procedures / Treatments   Labs (all labs ordered are listed, but only abnormal results are displayed) Labs Reviewed  CBC WITH DIFFERENTIAL/PLATELET - Abnormal; Notable for the following components:      Result Value   WBC 13.1 (*)    Neutro Abs 8.9 (*)    Eosinophils Absolute 0.6 (*)    Abs Immature Granulocytes 0.22 (*)    All other components within normal limits  BASIC METABOLIC PANEL - Abnormal; Notable for the following components:   Glucose, Bld 155 (*)    Calcium 8.8 (*)    All other components within normal limits  I-STAT BETA HCG BLOOD, ED ([facility], [facility], [facility] ONLY)    EKG None  Radiology CT Head Wo Contrast  Result Date: 06/25/2020 CLINICAL DATA:  Dizziness EXAM: CT HEAD WITHOUT CONTRAST TECHNIQUE: Contiguous axial images were obtained from the base of the skull through the vertex without intravenous contrast. COMPARISON:  11/05/2009 FINDINGS: Brain: No acute intracranial abnormality. Specifically, no hemorrhage, hydrocephalus, mass lesion, acute infarction, or significant intracranial injury. Vascular: No hyperdense vessel or unexpected calcification. Skull: No acute calvarial  abnormality. Sinuses/Orbits: No acute findings Other: None IMPRESSION: No acute intracranial abnormality. Electronically Signed   By: Charlett Nose M.D.   On: 06/25/2020 20:57    Procedures Procedures   Medications Ordered in ED Medications  sodium chloride 0.9 % bolus 1,000 mL (0 mLs Intravenous Stopped 06/25/20 2316)    ED Course  I have reviewed the triage vital signs and the nursing notes.  Pertinent labs & imaging results that were available during my care of the patient were reviewed by me and considered in my medical decision making (see chart for details).  Clinical Course as of 06/25/20 2321  Sun Jun 25, 2020  2125 I-stat hCG, quantitative: <5.0 [HK]  2125 Creatinine: 0.69 [HK]  2125 BUN: 7 [HK]    Clinical Course User Index [HK] Dietrich Pates, PA-C   MDM Rules/Calculators/A&P                          40 year old female presenting to the ED with a chief complaint of dizziness.  Reports 10-day history of dizziness that has been consistent but denies any worsening with ambulation.  Reports intermittent headache.  No vision changes, numbness in arms or legs, vomiting or diarrhea.  Was prescribed prednisone and meclizine when symptoms began 10 days ago but continues to have symptoms.  She was told to come to the ER for CT of her head.  Denies any changes to speech, fever, neck pain.  On exam patient without neurological deficits.  No numbness or weakness noted.  She remains ambulatory here without difficulty.  No ataxia noted.  No meningeal signs.  Vital signs within normal limits.  No facial asymmetry noted.  CT of the head here without any acute findings.  Lab work noting CBC, BMP unremarkable.  hCG is negative. Patient given IV fluids here with improvement in her symptoms.  She is requesting discharge home as her symptoms have improved.  I suspect that her symptoms are mostly likely due to vertigo or possible dehydration.  There are no headache characteristics that are lateralizing  or concerning for increased ICP, infectious or vascular cause of his symptoms.   She has no neurological deficits that would concern me for an emergent cause such as stroke or dissection. She will follow up with ENT and will give her instructions for Epley maneuver. Will give naproxen for headache which is most likely related to tension headache. She remains hemodynamically stable here.  Return precautions given.  Patient is hemodynamically stable, in NAD, and able to ambulate in the ED. Evaluation does not show pathology that would require ongoing emergent intervention or inpatient treatment. I explained the diagnosis to the patient. Pain has been managed and has no complaints prior to discharge. Patient is comfortable with above plan and is stable for discharge at this time. All questions were answered prior to disposition. Strict return precautions for returning to the ED were discussed. Encouraged follow up with PCP.   An After Visit Summary was printed and given to the patient.   Portions of this note were generated with Scientist, clinical (histocompatibility and immunogenetics). Dictation errors may occur despite best attempts at proofreading.  Final Clinical Impression(s) / ED Diagnoses Final diagnoses:  Vertigo  Tension headache    Rx / DC Orders ED Discharge Orders         Ordered    naproxen (NAPROSYN) 500 MG tablet  2 times daily        06/25/20 2304           Dietrich Pates, PA-C 06/25/20 2321    Margarita Grizzle, MD 06/27/20 1225

## 2021-01-29 ENCOUNTER — Other Ambulatory Visit: Payer: Self-pay

## 2021-01-29 ENCOUNTER — Encounter (HOSPITAL_COMMUNITY): Payer: Self-pay

## 2021-01-29 ENCOUNTER — Emergency Department (HOSPITAL_COMMUNITY)
Admission: EM | Admit: 2021-01-29 | Discharge: 2021-01-29 | Disposition: A | Discharge status: Home / Self Care | Payer: 59 | Attending: Emergency Medicine | Admitting: Emergency Medicine

## 2021-01-29 ENCOUNTER — Emergency Department (HOSPITAL_COMMUNITY): Payer: 59

## 2021-01-29 DIAGNOSIS — N9489 Other specified conditions associated with female genital organs and menstrual cycle: Secondary | ICD-10-CM | POA: Insufficient documentation

## 2021-01-29 DIAGNOSIS — R0602 Shortness of breath: Secondary | ICD-10-CM | POA: Diagnosis present

## 2021-01-29 LAB — BASIC METABOLIC PANEL
Anion gap: 5 (ref 5–15)
BUN: 10 mg/dL (ref 6–20)
CO2: 28 mmol/L (ref 22–32)
Calcium: 8.9 mg/dL (ref 8.9–10.3)
Chloride: 105 mmol/L (ref 98–111)
Creatinine, Ser: 0.68 mg/dL (ref 0.44–1.00)
GFR, Estimated: >60 mL/min (ref >60)
Glucose, Bld: 99 mg/dL (ref 70–99)
Potassium: 3.9 mmol/L (ref 3.5–5.1)
Sodium: 138 mmol/L (ref 135–145)

## 2021-01-29 LAB — CBC WITH DIFFERENTIAL/PLATELET
Abs Immature Granulocytes: 0.02 10*3/uL (ref 0.00–0.07)
Basophils Absolute: 0.1 10*3/uL (ref 0.0–0.1)
Basophils Relative: 1 %
Eosinophils Absolute: 0.4 10*3/uL (ref 0.0–0.5)
Eosinophils Relative: 5 %
HCT: 39 % (ref 36.0–46.0)
Hemoglobin: 12.9 g/dL (ref 12.0–15.0)
Immature Granulocytes: 0 %
Lymphocytes Relative: 29 %
Lymphs Abs: 2.1 10*3/uL (ref 0.7–4.0)
MCH: 30.6 pg (ref 26.0–34.0)
MCHC: 33.1 g/dL (ref 30.0–36.0)
MCV: 92.6 fL (ref 80.0–100.0)
Monocytes Absolute: 0.6 10*3/uL (ref 0.1–1.0)
Monocytes Relative: 8 %
Neutro Abs: 4 10*3/uL (ref 1.7–7.7)
Neutrophils Relative %: 57 %
Platelets: 233 10*3/uL (ref 150–400)
RBC: 4.21 MIL/uL (ref 3.87–5.11)
RDW: 12.4 % (ref 11.5–15.5)
WBC: 7.2 10*3/uL (ref 4.0–10.5)
nRBC: 0 % (ref 0.0–0.2)

## 2021-01-29 LAB — I-STAT BETA HCG BLOOD, ED (MC, WL, AP ONLY): I-stat hCG, quantitative: <5 m[IU]/mL (ref <5)

## 2021-01-29 LAB — TROPONIN I (HIGH SENSITIVITY)
Troponin I (High Sensitivity): <2 ng/L (ref <18)
Troponin I (High Sensitivity): <2 ng/L (ref <18)

## 2021-01-29 LAB — BRAIN NATRIURETIC PEPTIDE: B Natriuretic Peptide: 14.5 pg/mL (ref 0.0–100.0)

## 2021-01-29 NOTE — ED Provider Notes (Addendum)
[facility] EMERGENCY DEPARTMENT Provider Note   CSN: [id number] Arrival date & time: 01/29/21  1212     History No chief complaint on file.   Jessica Perry is a 40 y.o. female with history of gestational diabetes.  Presents to the emergency department with a chief complaint of shortness of breath.  Patient reports that shortness of breath has been present over the last month.  Breath is worse at night, with exertion, and when she is feeling tired.  Patient denies any chest pain associated with her symptoms.  Patient has tried albuterol inhaler with no relief of symptoms.  Patient also endorses orthopnea.  Denies any PND, chest pain, palpitations, leg swelling or tenderness, fever, chills, cough, rhinorrhea, nasal congestion, abdominal pain, nausea, vomiting, diaphoresis, unexpected weight loss, night sweats, history of DVT or PE, history of cancer, surgery in the last 4 weeks, birth control or hormone use.  Patient denies any illicit drug use, EtOH, or tobacco use.  HPI     Past Medical History:  Diagnosis Date   Gestational diabetes    With first pregnancy   No significant active problems     There are no problems to display for this patient.   Past Surgical History:  Procedure Laterality Date   TUBAL LIGATION  11/22/2011   Procedure: POST PARTUM TUBAL LIGATION;  Surgeon: Loney Laurence, MD;  Location: [facility];  Service: Gynecology;  Laterality: Bilateral;     OB History     Gravida  3   Para  2   Term  2   Preterm  0   AB  1   Living  2      SAB  1   IAB  0   Ectopic  0   Multiple  0   Live Births  2           Family History  Problem Relation Age of Onset   Heart disease Maternal Grandmother    Hypertension Maternal Grandmother    Diabetes Maternal Grandmother     Social History   Tobacco Use   Smoking status: Never   Smokeless tobacco: Never  Substance Use Topics   Alcohol use: No   Drug use: No    Home  Medications Prior to Admission medications   Medication Sig Start Date End Date Taking? Authorizing Provider  metroNIDAZOLE (FLAGYL) 500 MG tablet Take 1 tablet (500 mg total) by mouth 2 (two) times daily. 01/30/18   Shade Flood, MD  naproxen (NAPROSYN) 500 MG tablet Take 1 tablet (500 mg total) by mouth 2 (two) times daily. 06/25/20   Dietrich Pates, PA-C    Allergies    Patient has no known allergies.  Review of Systems   Review of Systems  Constitutional:  Negative for chills, fever and unexpected weight change.  HENT:  Negative for congestion, rhinorrhea and sore throat.   Eyes:  Negative for visual disturbance.  Respiratory:  Negative for cough, shortness of breath and wheezing.   Cardiovascular:  Negative for chest pain, palpitations and leg swelling.  Gastrointestinal:  Negative for abdominal pain, nausea and vomiting.  Musculoskeletal:  Negative for back pain and neck pain.  Skin:  Negative for color change and rash.  Neurological:  Negative for dizziness, syncope, light-headedness and headaches.  Psychiatric/Behavioral:  Negative for confusion.    Physical Exam Updated Vital Signs BP 110/86 (BP Location: Right Arm)   Pulse 71   Temp 97.6 F (36.4 C)   Resp  16   SpO2 100%   Physical Exam Vitals and nursing note reviewed.  Constitutional:      General: She is not in acute distress.    Appearance: She is not ill-appearing, toxic-appearing or diaphoretic.  HENT:     Head: Normocephalic.  Eyes:     General: No scleral icterus.       Right eye: No discharge.        Left eye: No discharge.  Cardiovascular:     Rate and Rhythm: Normal rate.     Heart sounds: Normal heart sounds.  Pulmonary:     Effort: Pulmonary effort is normal. No tachypnea, bradypnea or respiratory distress.     Breath sounds: Normal breath sounds. No stridor.  Abdominal:     General: There is no distension. There are no signs of injury.     Palpations: Abdomen is soft. There is no mass or  pulsatile mass.     Tenderness: There is no abdominal tenderness. There is no guarding or rebound.  Musculoskeletal:     Cervical back: Neck supple.     Right lower leg: No swelling, deformity, lacerations, tenderness or bony tenderness. No edema.     Left lower leg: No swelling, deformity, lacerations, tenderness or bony tenderness. No edema.  Skin:    General: Skin is warm and dry.  Neurological:     General: No focal deficit present.     Mental Status: She is alert.     GCS: GCS eye subscore is 4. GCS verbal subscore is 5. GCS motor subscore is 6.  Psychiatric:        Behavior: Behavior is cooperative.    ED Results / Procedures / Treatments   Labs (all labs ordered are listed, but only abnormal results are displayed) Labs Reviewed  BASIC METABOLIC PANEL  CBC WITH DIFFERENTIAL/PLATELET  BRAIN NATRIURETIC PEPTIDE  I-STAT BETA HCG BLOOD, ED ([facility], [facility], [facility] ONLY)  TROPONIN I (HIGH SENSITIVITY)  TROPONIN I (HIGH SENSITIVITY)    EKG EKG Interpretation  Date/Time:  Monday January 29 2021 12:36:03 EST Ventricular Rate:  86 PR Interval:  142 QRS Duration: 86 QT Interval:  346 QTC Calculation: 414 R Axis:   91 Text Interpretation: Normal sinus rhythm Rightward axis Borderline ECG No prior ECG for comparison. NO STEMI Confirmed by Antony Blackbird [phone]) on 01/29/2021 4:08:00 PM  Radiology DG Chest 2 View  Result Date: 01/29/2021 CLINICAL DATA:  Chest pain and shortness of breath for 1 month. Asthma. EXAM: CHEST - 2 VIEW COMPARISON:  11/05/2009 FINDINGS: The heart size and mediastinal contours are within normal limits. Both lungs are clear. The visualized skeletal structures are unremarkable. IMPRESSION: Normal exam. Electronically Signed   By: Marlaine Hind M.D.   On: 01/29/2021 13:32    Procedures Procedures   Medications Ordered in ED Medications - No data to display  ED Course  I have reviewed the triage vital signs and the nursing notes.  Pertinent labs & imaging  results that were available during my care of the patient were reviewed by me and considered in my medical decision making (see chart for details).    MDM Rules/Calculators/A&P                           Alert 40 year old female no acute stress, nontoxic-appearing.  Presents to ED with chief complaint of shortness of breath.  Touch breath is present over the last month.  Worse with exertion, at night, and  when fatigued.  Endorses orthopnea.  No associated chest pain, palpitations, lightheadedness, syncope, leg swelling or tenderness.  No leg swelling or tenderness.  Suspicion for DVT at this time.  Patient low risk for PE based on PERC criteria.  Low suspicion for ACS as EKG shows normal sinus rhythm, delta Trope negative, chest x-ray shows no active cardiopulmonary disease.  BNP within normal limits.  Low suspicion for acute CHF at this time.  Lungs clear to auscultation bilaterally, low suspicion for acute bronchitis, asthma exacerbation, or pneumonia.  Patient reports that she has follow-up with pulmonology on 11/30.  We will give patient referral to cardiology.  Will discharge patient at this time.  Discussed results, findings, treatment and follow up. Patient advised of return precautions. Patient verbalized understanding and agreed with plan.  Patient care discussed with attending physician Dr.Tegeler  Patient was offered [place] interpreter however request that her family have her at bedside interpret for interview and examination.  Final Clinical Impression(s) / ED Diagnoses Final diagnoses:  SOB (shortness of breath)    Rx / DC Orders ED Discharge Orders     None        Loni Beckwith, PA-C 01/29/21 1644    Loni Beckwith, PA-C 01/29/21 1645    Tegeler, Gwenyth Allegra, MD 01/29/21 2035

## 2021-01-29 NOTE — ED Triage Notes (Signed)
Patient complains of exertional SOB x 1 month. Has been seen in clinic for same and used inhaler with no relief. Non-smoker. Alert and oriented, appears in no distress

## 2021-01-29 NOTE — Discharge Instructions (Addendum)
You came to the emergency department today to be evaluated for your shortness of breath.  Your physical exam was reassuring.  Your chest x-ray showed no abnormalities.  The lab work obtained today was reassuring that you are not having an acute heart attack.  Please follow-up with your pulmonologist on 11/30.  I have given you a referral to cardiology.  They should contact you in the next 2-3 business days to schedule a follow-up appointment.  Please continue to use your prescribed inhaler as needed.  Get help right away if: Your shortness of breath gets worse. You have shortness of breath when you are resting. You feel light-headed or you faint. You have a cough that is not controlled with medicines. You cough up blood. You have pain with breathing. You have pain in your chest, arms, shoulders, or abdomen. You have a fever.

## 2021-01-29 NOTE — ED Provider Notes (Signed)
Emergency Medicine Provider Triage Evaluation Note  Jessica Perry , a 40 y.o. female  was evaluated in triage.  Pt complains of shortness of breath x1 month.  Is worse with exertion, also having orthopnea at night.  Seen in urgent care, given an inhaler which has not provided any relief..  Does not smoke cigarettes, no history of asthma or pulmonary disease.  Also having chest tightness for the last couple weeks.  It comes and goes.  Review of Systems  Positive: Shortness of breath, chest pain Negative: Fevers, cough  Physical Exam  BP 120/82 (BP Location: Left Arm)   Pulse 84   Temp 97.6 F (36.4 C)   Resp 17   SpO2 100%  Gen:   Awake, no distress   Resp:  Normal effort lung sounds somewhat diminished in the bases, no wheezes rales or rhonchi appreciated MSK:   Moves extremities without difficulty  Other:    Medical Decision Making  Medically screening exam initiated at 12:48 PM.  Appropriate orders placed.  [name] was informed that the remainder of the evaluation will be completed by another provider, this initial triage assessment does not replace that evaluation, and the importance of remaining in the ED until their evaluation is complete.  Shortness of breath work-up   Theron Arista, Cordelia Poche 01/29/21 1249    Mancel Bale, MD 01/29/21 [phone]

## 2021-02-07 ENCOUNTER — Encounter: Payer: Self-pay | Admitting: Pulmonary Disease

## 2021-02-07 ENCOUNTER — Ambulatory Visit (INDEPENDENT_AMBULATORY_CARE_PROVIDER_SITE_OTHER): Payer: 59 | Admitting: Pulmonary Disease

## 2021-02-07 ENCOUNTER — Other Ambulatory Visit: Payer: Self-pay

## 2021-02-07 VITALS — BP 90/60 | HR 88 | Temp 98.1°F | Ht 64.0 in | Wt 137.6 lb

## 2021-02-07 DIAGNOSIS — J45901 Unspecified asthma with (acute) exacerbation: Secondary | ICD-10-CM | POA: Diagnosis not present

## 2021-02-07 DIAGNOSIS — R0602 Shortness of breath: Secondary | ICD-10-CM | POA: Diagnosis not present

## 2021-02-07 MED ORDER — ALBUTEROL SULFATE HFA 108 (90 BASE) MCG/ACT IN AERS: 2.0000 | INHALATION_SPRAY | Freq: Four times a day (QID) | RESPIRATORY_TRACT | 6 refills | Status: DC | PRN

## 2021-02-07 MED ORDER — MONTELUKAST SODIUM 10 MG PO TABS: 10.0000 mg | ORAL_TABLET | Freq: Every day | ORAL | 11 refills | Status: DC

## 2021-02-07 MED ORDER — FLUTICASONE-SALMETEROL 100-50 MCG/ACT IN AEPB: 1.0000 | INHALATION_SPRAY | Freq: Two times a day (BID) | RESPIRATORY_TRACT | 5 refills | Status: DC

## 2021-02-07 MED ORDER — PREDNISONE 20 MG PO TABS: 40.0000 mg | ORAL_TABLET | Freq: Every day | ORAL | 0 refills | Status: AC

## 2021-02-07 NOTE — Progress Notes (Signed)
Subjective:   PATIENT ID: Jessica Perry GENDER: female DOB: [birth year], MRN: [id number]   HPI  Chief Complaint  Patient presents with   Consult    Sob for more than a month for unknown reason.    Reason for Visit: New consult for shortness of breath  Ms. Jessica Perry is a 40 year old female never smoker with hx of gestastional diabetes who presents as a new patient  ED note from 01/29/21 was reviewed. She has worsening dyspnea at night and exertion. ACS work-up was negative. She was scheduled for Pulmonary and referred to Cardiology.  She reports one month history of shortness of breath. Seems to occur when she overworks her. She finds herself gasping even walking back and forth while cooking. Has an Media pl[employer]. She has tried Albuterol up to twice a day with partial relief. She reports difficulty taking a deep breath. She has had similar symptoms 15 years ago and was told she had low oxygen. No sensitivity to strong odors or chemicals. She works at a [employer]. Denies wheezing or coughing. She states over the years she has developed allergies to avocado and durian including throat itchiness but no difficulty breathing.  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  Past Medical History:  Diagnosis Date   Gestational diabetes    With first pregnancy   No significant active problems      Family History  Problem Relation Age of Onset   Heart disease Maternal Grandmother    Hypertension Maternal Grandmother    Diabetes Maternal Grandmother      Social History   Occupational History   Not on file  Tobacco Use   Smoking status: Never   Smokeless tobacco: Never  Substance and Sexual Activity   Alcohol use: No   Drug use: No   Sexual activity: Not on file    No Known Allergies   Outpatient Medications Prior to  Visit  Medication Sig Dispense Refill   albuterol (VENTOLIN HFA) 108 (90 Base) MCG/ACT inhaler SMARTSIG:2 Puff(s) By Mouth Every 4-6 Hours PRN     metroNIDAZOLE (FLAGYL) 500 MG tablet Take 1 tablet (500 mg total) by mouth 2 (two) times daily. 14 tablet 0   naproxen (NAPROSYN) 500 MG tablet Take 1 tablet (500 mg total) by mouth 2 (two) times daily. 30 tablet 0   No facility-administered medications prior to visit.    Review of Systems  Constitutional:  Negative for chills, diaphoresis, fever, malaise/fatigue and weight loss.  HENT:  Negative for congestion, ear pain and sore throat.   Respiratory:  Positive for shortness of breath. Negative for cough, hemoptysis, sputum production and wheezing.   Cardiovascular:  Negative for chest pain, palpitations and leg swelling.  Gastrointestinal:  Negative for abdominal pain, heartburn and nausea.  Genitourinary:  Negative for frequency.  Musculoskeletal:  Negative for joint pain and myalgias.  Skin:  Negative for itching and rash.  Neurological:  Negative for dizziness, weakness and headaches.  Endo/Heme/Allergies:  Does not bruise/bleed easily.  Psychiatric/Behavioral:  Negative for depression. The patient is not nervous/anxious.     Objective:   Vitals:   02/07/21 1441  BP: 90/60  Pulse: 88  SpO2: 99%   SpO2: 99 % O2 Device: None (Room air)  Physical Exam: General: Well-appearing, no acute distress HENT: [place], AT Eyes: EOMI, no scleral icterus Respiratory: Clear to auscultation bilaterally.  No crackles, wheezing or rales Cardiovascular: RRR, -M/R/G, no JVD Extremities:-Edema,-tenderness Neuro: AAO x4, CNII-XII grossly intact Psych: Normal mood, normal affect  Data Reviewed:  Imaging: CXR 01/29/21 - No infiltrate, effusion or edema  PFT: None on file  Labs: Absolute eos 06/25/20 600  01/29/21 400     Assessment & Plan:   Discussion: 40 year old female never smoker who presents with shortness of breath and chest  tightness. Labs with peripheral eosinophilia. Discussed clinical symptoms concerning for asthma and management with bronchodilators. Reviewed medical plan as noted below. Provided inhaler teaching  Shortness of breath, chest tightness Suspected asthma exacerbation --Prednisone 40 mg x 5 days --START Advair 100-50 mcg ONE puff TWICE a day. Rinse mouth after each use --START Albuterol AS NEEDED for shortness of breath or wheezing --START montelukast 10 mg nightly --ARRANGE for pulmonary function tests prior to next visit  Food allergies --Offered Allergy referral however she has declined for now  Health Maintenance Immunization History  Administered Date(s) Administered   PFIZER(Purple Top)SARS-COV-2 Vaccination 01/22/2020   CT Lung Screen - not indicated  Orders Placed This Encounter  Procedures   Pulmonary function test    Standing Status:   Future    Standing Expiration Date:   06/07/2021    Order Specific Question:   Where should this test be performed?    Answer:   [facility]    Order Specific Question:   Full PFT: includes the following: basic spirometry, spirometry pre & post bronchodilator, diffusion capacity (DLCO), lung volumes    Answer:   Full PFT   Meds ordered this encounter  Medications   montelukast (SINGULAIR) 10 MG tablet    Sig: Take 1 tablet (10 mg total) by mouth at bedtime.    Dispense:  30 tablet    Refill:  11   fluticasone-salmeterol (ADVAIR DISKUS) 100-50 MCG/ACT AEPB    Sig: Inhale 1 puff into the lungs 2 (two) times daily.    Dispense:  60 each    Refill:  5   albuterol (VENTOLIN HFA) 108 (90 Base) MCG/ACT inhaler    Sig: Inhale 2 puffs into the lungs every 6 (six) hours as needed for wheezing or shortness of breath.    Dispense:  8 g    Refill:  6   predniSONE (DELTASONE) 20 MG tablet    Sig: Take 2 tablets (40 mg total) by mouth daily with breakfast for 5 days.    Dispense:  10 tablet    Refill:  0    Return in about 2 months (around  04/16/2021).  I have spent a total time of 45-minutes on the day of the appointment reviewing prior documentation, coordinating care and discussing medical diagnosis and plan with the patient/family. Imaging, labs and tests included in this note have been reviewed and interpreted independently by me.  Phenix Vandermeulen Mechele Collin, MD [facility] 02/07/2021 2:43 PM  Office Number [phone]

## 2021-02-07 NOTE — Patient Instructions (Addendum)
Shortness of breath, chest tightness Suspected asthma exacerbation --Prednisone 40 mg x 5 days --START Advair 100-50 mcg ONE puff TWICE a day. Rinse mouth after each use --START Albuterol AS NEEDED for shortness of breath or wheezing --START montelukast 10 mg nightly --ARRANGE for pulmonary function tests prior to next visit  Follow-up with me in 2 months with PFTs

## 2021-02-09 ENCOUNTER — Encounter: Payer: Self-pay | Admitting: Pulmonary Disease

## 2021-04-16 ENCOUNTER — Ambulatory Visit: Payer: Self-pay | Admitting: Pulmonary Disease

## 2021-04-21 ENCOUNTER — Ambulatory Visit: Payer: Self-pay

## 2021-04-23 ENCOUNTER — Other Ambulatory Visit: Payer: Self-pay

## 2021-04-23 ENCOUNTER — Ambulatory Visit
Admission: EM | Admit: 2021-04-23 | Discharge: 2021-04-23 | Disposition: A | Discharge status: Home / Self Care | Payer: Self-pay | Attending: Student | Admitting: Student

## 2021-04-23 DIAGNOSIS — R051 Acute cough: Secondary | ICD-10-CM

## 2021-04-23 MED ORDER — PREDNISONE 20 MG PO TABS: 40.0000 mg | ORAL_TABLET | Freq: Every day | ORAL | 0 refills | Status: AC

## 2021-04-23 NOTE — ED Provider Notes (Signed)
[facility]    CSN: [id number] Arrival date & time: 04/23/21  1207      History   Chief Complaint Chief Complaint  Patient presents with   Cough    HPI Jessica Perry is a 41 y.o. female with cough for 5 days.  Medical history asthma, allergies; currently unmedicated.  Describes nonproductive cough for about 5 days, associated with myalgias.  Denies other symptoms including shortness of breath, chest pain, dizziness, fever/chills, weakness, nausea/vomiting/diarrhea.  Denies known sick exposures.  Though she is taking no medications for her allergies and asthma, she is adamant that this is not the cause of her symptoms.  Has attempted over-the-counter cough medication.  HPI  Past Medical History:  Diagnosis Date   Gestational diabetes    With first pregnancy   No significant active problems     Patient Active Problem List   Diagnosis Date Noted   Shortness of breath 02/07/2021    Past Surgical History:  Procedure Laterality Date   TUBAL LIGATION  11/22/2011   Procedure: POST PARTUM TUBAL LIGATION;  Surgeon: Daria Pastures, MD;  Location: [facility];  Service: Gynecology;  Laterality: Bilateral;    OB History     Gravida  3   Para  2   Term  2   Preterm  0   AB  1   Living  2      SAB  1   IAB  0   Ectopic  0   Multiple  0   Live Births  2            Home Medications    Prior to Admission medications   Medication Sig Start Date End Date Taking? Authorizing Provider  predniSONE (DELTASONE) 20 MG tablet Take 2 tablets (40 mg total) by mouth daily for 5 days. Take with breakfast or lunch. Avoid NSAIDs (ibuprofen, etc) while taking this medication. 04/23/21 04/28/21 Yes Hazel Sams, PA-C  albuterol (VENTOLIN HFA) 108 (90 Base) MCG/ACT inhaler SMARTSIG:2 Puff(s) By Mouth Every 4-6 Hours PRN 01/11/21   [provider]  albuterol (VENTOLIN HFA) 108 (90 Base) MCG/ACT inhaler Inhale 2 puffs into the lungs every 6 (six) hours as needed for  wheezing or shortness of breath. 02/07/21   Margaretha Seeds, MD  fluticasone-salmeterol (ADVAIR DISKUS) 100-50 MCG/ACT AEPB Inhale 1 puff into the lungs 2 (two) times daily. 02/07/21   Margaretha Seeds, MD  montelukast (SINGULAIR) 10 MG tablet Take 1 tablet (10 mg total) by mouth at bedtime. 02/07/21   Margaretha Seeds, MD    Family History Family History  Problem Relation Age of Onset   Heart disease Maternal Grandmother    Hypertension Maternal Grandmother    Diabetes Maternal Grandmother     Social History Social History   Tobacco Use   Smoking status: Never   Smokeless tobacco: Never  Substance Use Topics   Alcohol use: No   Drug use: No     Allergies   Patient has no known allergies.   Review of Systems Review of Systems  Constitutional:  Negative for appetite change, chills and fever.  HENT:  Positive for congestion. Negative for ear pain, rhinorrhea, sinus pressure, sinus pain and sore throat.   Eyes:  Negative for redness and visual disturbance.  Respiratory:  Positive for cough. Negative for chest tightness, shortness of breath and wheezing.   Cardiovascular:  Negative for chest pain and palpitations.  Gastrointestinal:  Negative for abdominal pain, constipation, diarrhea, nausea  and vomiting.  Genitourinary:  Negative for dysuria, frequency and urgency.  Musculoskeletal:  Negative for myalgias.  Neurological:  Negative for dizziness, weakness and headaches.  Psychiatric/Behavioral:  Negative for confusion.   All other systems reviewed and are negative.   Physical Exam Triage Vital Signs ED Triage Vitals  Enc Vitals Group     BP 04/23/21 1414 94/67     Pulse Rate 04/23/21 1414 64     Resp 04/23/21 1414 18     Temp 04/23/21 1414 97.8 F (36.6 C)     Temp Source 04/23/21 1414 Oral     SpO2 04/23/21 1414 97 %     Weight --      Height --      Head Circumference --      Peak Flow --      Pain Score 04/23/21 1412 0     Pain Loc --      Pain Edu? --       Excl. in [place]? --    No data found.  Updated Vital Signs BP 94/67 (BP Location: Right Arm)    Pulse 64    Temp 97.8 F (36.6 C) (Oral)    Resp 18    LMP 04/09/2021 (Approximate)    SpO2 97%   Visual Acuity Right Eye Distance:   Left Eye Distance:   Bilateral Distance:    Right Eye Near:   Left Eye Near:    Bilateral Near:     Physical Exam Vitals reviewed.  Constitutional:      General: She is not in acute distress.    Appearance: Normal appearance. She is not ill-appearing.  HENT:     Head: Normocephalic and atraumatic.     Right Ear: Tympanic membrane, ear canal and external ear normal. No tenderness. No middle ear effusion. There is no impacted cerumen. Tympanic membrane is not perforated, erythematous, retracted or bulging.     Left Ear: Tympanic membrane, ear canal and external ear normal. No tenderness.  No middle ear effusion. There is no impacted cerumen. Tympanic membrane is not perforated, erythematous, retracted or bulging.     Nose: Nose normal. No congestion.     Mouth/Throat:     Mouth: Mucous membranes are moist.     Pharynx: Uvula midline. No oropharyngeal exudate or posterior oropharyngeal erythema.  Eyes:     Extraocular Movements: Extraocular movements intact.     Pupils: Pupils are equal, round, and reactive to light.  Cardiovascular:     Rate and Rhythm: Normal rate and regular rhythm.     Heart sounds: Normal heart sounds.  Pulmonary:     Effort: Pulmonary effort is normal.     Breath sounds: Normal breath sounds. No decreased breath sounds, wheezing, rhonchi or rales.  Abdominal:     Palpations: Abdomen is soft.     Tenderness: There is no abdominal tenderness. There is no guarding or rebound.  Lymphadenopathy:     Cervical: No cervical adenopathy.     Right cervical: No superficial cervical adenopathy.    Left cervical: No superficial cervical adenopathy.  Neurological:     General: No focal deficit present.     Mental Status: She is alert  and oriented to person, place, and time.  Psychiatric:        Mood and Affect: Mood normal.        Behavior: Behavior normal.        Thought Content: Thought content normal.  Judgment: Judgment normal.     UC Treatments / Results  Labs (all labs ordered are listed, but only abnormal results are displayed) Labs Reviewed  COVID-19, FLU A+B NAA    EKG   Radiology No results found.  Procedures Procedures (including critical care time)  Medications Ordered in UC Medications - No data to display  Initial Impression / Assessment and Plan / UC Course  I have reviewed the triage vital signs and the nursing notes.  Pertinent labs & imaging results that were available during my care of the patient were reviewed by me and considered in my medical decision making (see chart for details).     This patient is a very pleasant 41 y.o. year old female presenting with cough x5 days.  Afebrile, nontachycardic.  History of allergies and asthma, this is currently unmedicated; suspect that this contributes to today symptoms, but she is adamant that she does not need medications for this.  We will check a COVID and influenza PCR.  Low-dose prednisone sent as below.  Continue over-the-counter medications. ED return precautions discussed. Patient verbalizes understanding and agreement.    Final Clinical Impressions(s) / UC Diagnoses   Final diagnoses:  Acute cough     Discharge Instructions      -Prednisone, 2 pills taken at the same time for 5 days in a row.  Try taking this earlier in the day as it can give you energy. Avoid NSAIDs like ibuprofen and alleve while taking this medication as they can increase your risk of stomach upset and even GI bleeding when in combination with a steroid. You can continue tylenol (acetaminophen) up to 1000mg  3x daily.  -With a virus, you're typically contagious for 5-7 days, or as long as you're having fevers.       ED Prescriptions      Medication Sig Dispense Auth. Provider   predniSONE (DELTASONE) 20 MG tablet Take 2 tablets (40 mg total) by mouth daily for 5 days. Take with breakfast or lunch. Avoid NSAIDs (ibuprofen, etc) while taking this medication. 10 tablet Hazel Sams, PA-C      PDMP not reviewed this encounter.   Hazel Sams, PA-C 04/23/21 1458

## 2021-04-23 NOTE — Discharge Instructions (Addendum)
-  Prednisone, 2 pills taken at the same time for 5 days in a row.  Try taking this earlier in the day as it can give you energy. Avoid NSAIDs like ibuprofen and alleve while taking this medication as they can increase your risk of stomach upset and even GI bleeding when in combination with a steroid. You can continue tylenol (acetaminophen) up to 1000mg 3x daily. °-With a virus, you're typically contagious for 5-7 days, or as long as you're having fevers.  ° °

## 2021-04-23 NOTE — ED Triage Notes (Signed)
Pt reports having a cough for about 5 days.

## 2021-04-25 LAB — COVID-19, FLU A+B NAA
Influenza A, NAA: NOT DETECTED
Influenza B, NAA: NOT DETECTED
SARS-CoV-2, NAA: NOT DETECTED

## 2021-08-21 ENCOUNTER — Ambulatory Visit
Admission: EM | Admit: 2021-08-21 | Discharge: 2021-08-21 | Disposition: A | Discharge status: Home / Self Care | Payer: 59 | Attending: Emergency Medicine | Admitting: Emergency Medicine

## 2021-08-21 DIAGNOSIS — J4541 Moderate persistent asthma with (acute) exacerbation: Secondary | ICD-10-CM | POA: Diagnosis not present

## 2021-08-21 MED ORDER — ALBUTEROL SULFATE (2.5 MG/3ML) 0.083% IN NEBU
2.5000 mg | INHALATION_SOLUTION | Freq: Once | RESPIRATORY_TRACT | Status: AC
  Administered 2021-08-21: 2.5 mg via RESPIRATORY_TRACT

## 2021-08-21 MED ORDER — ALBUTEROL SULFATE HFA 108 (90 BASE) MCG/ACT IN AERS: 2.0000 | INHALATION_SPRAY | Freq: Four times a day (QID) | RESPIRATORY_TRACT | 5 refills | Status: DC | PRN

## 2021-08-21 MED ORDER — MONTELUKAST SODIUM 10 MG PO TABS: 10.0000 mg | ORAL_TABLET | Freq: Every day | ORAL | 5 refills | Status: DC

## 2021-08-21 MED ORDER — METHYLPREDNISOLONE 4 MG PO TBPK
ORAL_TABLET | ORAL | 0 refills | Status: DC
Start: 2021-08-21 — End: 2021-08-28

## 2021-08-21 MED ORDER — TRELEGY ELLIPTA 100-62.5-25 MCG/ACT IN AEPB: 1.0000 | INHALATION_SPRAY | Freq: Every morning | RESPIRATORY_TRACT | 5 refills | Status: DC

## 2021-08-21 NOTE — ED Triage Notes (Signed)
Patient states she has SOB and weakness. The patient states this has been going on and off for about 3 months.  Started: 2 weeks  Home interventions: none

## 2021-08-21 NOTE — ED Provider Notes (Signed)
[facility]    CSN: [id number] Arrival date & time: 08/21/21  1002    HISTORY   Chief Complaint  Patient presents with   Shortness of Breath   HPI Jessica Perry is a 41 y.o. female. Patient presents urgent care complaining of intermittent shortness of breath and weakness present for about 3 months.  Patient states she has began to feel worse over the past week.  Patient has been seen by pulmonology at [facility], most recently on January 30, 2021.  Patient was prescribed Advair, albuterol and Singulair and advised to return in 2 months after having pulmonary function testing performed.  Patient never had PFTs and patient never followed up.  Patient is also not currently taking any these prescribed medications despite there being multiple available refills.  Patient was seen again in February of this year for similar complaints at urgent care, patient was provided with a prescription for prednisone.  The history is provided by the patient. A language interpreter was used (family member).   Past Medical History:  Diagnosis Date   Gestational diabetes    With first pregnancy   No significant active problems    Patient Active Problem List   Diagnosis Date Noted   Shortness of breath 02/07/2021   Past Surgical History:  Procedure Laterality Date   TUBAL LIGATION  11/22/2011   Procedure: POST PARTUM TUBAL LIGATION;  Surgeon: Daria Pastures, MD;  Location: [facility];  Service: Gynecology;  Laterality: Bilateral;   OB History     Gravida  3   Para  2   Term  2   Preterm  0   AB  1   Living  2      SAB  1   IAB  0   Ectopic  0   Multiple  0   Live Births  2          Home Medications    Prior to Admission medications   Medication Sig Start Date End Date Taking? Authorizing Provider  albuterol (VENTOLIN HFA) 108 (90 Base) MCG/ACT inhaler SMARTSIG:2 Puff(s) By Mouth Every 4-6 Hours PRN 01/11/21   [provider]  albuterol (VENTOLIN HFA) 108 (90  Base) MCG/ACT inhaler Inhale 2 puffs into the lungs every 6 (six) hours as needed for wheezing or shortness of breath. 02/07/21   Margaretha Seeds, MD  fluticasone-salmeterol (ADVAIR DISKUS) 100-50 MCG/ACT AEPB Inhale 1 puff into the lungs 2 (two) times daily. 02/07/21   Margaretha Seeds, MD  montelukast (SINGULAIR) 10 MG tablet Take 1 tablet (10 mg total) by mouth at bedtime. 02/07/21   Margaretha Seeds, MD   Family History Family History  Problem Relation Age of Onset   Heart disease Maternal Grandmother    Hypertension Maternal Grandmother    Diabetes Maternal Grandmother    Social History Social History   Tobacco Use   Smoking status: Never   Smokeless tobacco: Never  Substance Use Topics   Alcohol use: No   Drug use: No   Allergies   Patient has no known allergies.  Review of Systems Review of Systems Pertinent findings noted in history of present illness.   Physical Exam Triage Vital Signs ED Triage Vitals  Enc Vitals Group     BP 01/05/21 0827 (!) 147/82     Pulse Rate 01/05/21 0827 72     Resp 01/05/21 0827 18     Temp 01/05/21 0827 98.3 F (36.8 C)     Temp Source  01/05/21 0827 Oral     SpO2 01/05/21 0827 98 %     Weight --      Height --      Head Circumference --      Peak Flow --      Pain Score 01/05/21 0826 5     Pain Loc --      Pain Edu? --      Excl. in [place]? --   No data found.  Updated Vital Signs BP 105/72 (BP Location: Right Arm)   Pulse 84   Temp 98 F (36.7 C) (Oral)   Resp 16   LMP 07/25/2021 (Approximate)   SpO2 98%   Physical Exam Vitals and nursing note reviewed.  Constitutional:      General: She is not in acute distress.    Appearance: Normal appearance. She is not ill-appearing.  HENT:     Head: Normocephalic and atraumatic.     Salivary Glands: Right salivary gland is not diffusely enlarged or tender. Left salivary gland is not diffusely enlarged or tender.     Right Ear: Tympanic membrane, ear canal and external ear  normal. No drainage. No middle ear effusion. There is no impacted cerumen. Tympanic membrane is not erythematous or bulging.     Left Ear: Tympanic membrane, ear canal and external ear normal. No drainage.  No middle ear effusion. There is no impacted cerumen. Tympanic membrane is not erythematous or bulging.     Nose: Nose normal. No nasal deformity, septal deviation, mucosal edema, congestion or rhinorrhea.     Right Turbinates: Not enlarged, swollen or pale.     Left Turbinates: Not enlarged, swollen or pale.     Right Sinus: No maxillary sinus tenderness or frontal sinus tenderness.     Left Sinus: No maxillary sinus tenderness or frontal sinus tenderness.     Mouth/Throat:     Lips: Pink. No lesions.     Mouth: Mucous membranes are moist. No oral lesions.     Pharynx: Oropharynx is clear. Uvula midline. No posterior oropharyngeal erythema or uvula swelling.     Tonsils: No tonsillar exudate. 0 on the right. 0 on the left.  Eyes:     General: Lids are normal.        Right eye: No discharge.        Left eye: No discharge.     Extraocular Movements: Extraocular movements intact.     Conjunctiva/sclera: Conjunctivae normal.     Right eye: Right conjunctiva is not injected.     Left eye: Left conjunctiva is not injected.  Neck:     Trachea: Trachea and phonation normal.  Cardiovascular:     Rate and Rhythm: Normal rate and regular rhythm.     Pulses: Normal pulses.     Heart sounds: Normal heart sounds. No murmur heard.    No friction rub. No gallop.  Pulmonary:     Effort: Pulmonary effort is normal. No tachypnea, bradypnea, accessory muscle usage, prolonged expiration, respiratory distress or retractions.     Breath sounds: Normal air entry. No stridor, decreased air movement or transmitted upper airway sounds. Examination of the right-upper field reveals decreased breath sounds. Examination of the left-upper field reveals decreased breath sounds. Examination of the right-middle field  reveals decreased breath sounds. Examination of the left-middle field reveals decreased breath sounds. Examination of the right-lower field reveals decreased breath sounds. Examination of the left-lower field reveals decreased breath sounds. Decreased breath sounds present. No wheezing, rhonchi or  rales.  Chest:     Chest wall: No tenderness.  Musculoskeletal:        General: Normal range of motion.     Cervical back: Normal range of motion and neck supple. Normal range of motion.  Lymphadenopathy:     Cervical: No cervical adenopathy.  Skin:    General: Skin is warm and dry.     Findings: No erythema or rash.  Neurological:     General: No focal deficit present.     Mental Status: She is alert and oriented to person, place, and time.  Psychiatric:        Mood and Affect: Mood normal.        Behavior: Behavior normal.     Visual Acuity Right Eye Distance:   Left Eye Distance:   Bilateral Distance:    Right Eye Near:   Left Eye Near:    Bilateral Near:     UC Couse / Diagnostics / Procedures:    EKG  Radiology No results found.  Procedures Procedures (including critical care time)  UC Diagnoses / Final Clinical Impressions(s)   I have reviewed the triage vital signs and the nursing notes.  Pertinent labs & imaging results that were available during my care of the patient were reviewed by me and considered in my medical decision making (see chart for details).   Final diagnoses:  Moderate persistent asthma with (acute) exacerbation   Patient had modest improvement of breath sounds after nebulized albuterol treatment, patient states she did not feel a meaningful improvement of her work of breathing.  Patient provided with renewals of Singulair and Ventolin.  Patient provided with Trelegy instead of Advair because I am able to provide her with a coupon for this.  Patient also provided with a Medrol Dosepak to further calm her lungs and improve her work of breathing.  Return  precautions advised.  Patient strongly encouraged to follow-up with a new pulmonologist if her current pulmonologist is no longer taking her health insurance.  ED Prescriptions     Medication Sig Dispense Auth. Provider   albuterol (VENTOLIN HFA) 108 (90 Base) MCG/ACT inhaler Inhale 2 puffs into the lungs every 6 (six) hours as needed for wheezing or shortness of breath (as needed, this is a rescue inhaler). 36 g Lynden Oxford Scales, PA-C   Fluticasone-Umeclidin-Vilant (TRELEGY ELLIPTA) 100-62.5-25 MCG/ACT AEPB Inhale 1 puff into the lungs in the morning. 1 each Lynden Oxford Scales, PA-C   montelukast (SINGULAIR) 10 MG tablet Take 1 tablet (10 mg total) by mouth at bedtime. 30 tablet Lynden Oxford Scales, PA-C   methylPREDNISolone (MEDROL DOSEPAK) 4 MG TBPK tablet Take 24 mg on day 1, 20 mg on day 2, 16 mg on day 3, 12 mg on day 4, 8 mg on day 5, 4 mg on day 6.  Take all tablets in each row at once, do not spread tablets out throughout the day. 21 tablet Lynden Oxford Scales, PA-C      PDMP not reviewed this encounter.  Pending results:  Labs Reviewed - No data to display  Medications Ordered in UC: Medications  albuterol (PROVENTIL) (2.5 MG/3ML) 0.083% nebulizer solution 2.5 mg (2.5 mg Nebulization Given 08/21/21 1054)    Disposition Upon Discharge:  Condition: stable for discharge home Home: take medications as prescribed; routine discharge instructions as discussed; follow up as advised.  Patient presented with an acute illness with associated systemic symptoms and significant discomfort requiring urgent management. In my opinion, this is a condition  that a prudent lay person (someone who possesses an average knowledge of health and medicine) may potentially expect to result in complications if not addressed urgently such as respiratory distress, impairment of bodily function or dysfunction of bodily organs.   Routine symptom specific, illness specific and/or disease specific  instructions were discussed with the patient and/or caregiver at length.   As such, the patient has been evaluated and assessed, work-up was performed and treatment was provided in alignment with urgent care protocols and evidence based medicine.  Patient/parent/caregiver has been advised that the patient may require follow up for further testing and treatment if the symptoms continue in spite of treatment, as clinically indicated and appropriate.  If the patient was tested for COVID-19, Influenza and/or RSV, then the patient/parent/guardian was advised to isolate at home pending the results of his/her diagnostic coronavirus test and potentially longer if they're positive. I have also advised pt that if his/her COVID-19 test returns positive, it's recommended to self-isolate for at least 10 days after symptoms first appeared AND until fever-free for 24 hours without fever reducer AND other symptoms have improved or resolved. Discussed self-isolation recommendations as well as instructions for household member/close contacts as per the [facility] and [place] DHHS, and also gave patient the [facility] packet with this information.  Patient/parent/caregiver has been advised to return to the [facility] or PCP in 3-5 days if no better; to PCP or the Emergency Department if new signs and symptoms develop, or if the current signs or symptoms continue to change or worsen for further workup, evaluation and treatment as clinically indicated and appropriate  The patient will follow up with their current PCP if and as advised. If the patient does not currently have a PCP we will assist them in obtaining one.   The patient may need specialty follow up if the symptoms continue, in spite of conservative treatment and management, for further workup, evaluation, consultation and treatment as clinically indicated and appropriate.  Patient/parent/caregiver verbalized understanding and agreement of plan as discussed.  All questions were addressed  during visit.  Please see discharge instructions below for further details of plan.  Discharge Instructions:   Discharge Instructions      I have renewed your prescriptions for albuterol and Singulair.  I changed your prescription for Advair to a different medication called Trelegy that I can help you get for free, I provided you with a coupon.  Please see the list below for recommended medications, dosages and frequencies:    Medrol Dosepak (methylprednisolone): This is a steroid that will significantly calm your upper and lower airways and make your breathing much easier.  Please take one row of tablets daily with your breakfast meal starting tomorrow morning until the prescription is complete.     Singulair (montelukast): This is a mast cell stabilizer that works well with antihistamines.  Mast cells are responsible for stimulating histamine production so you can imagine that if we can reduce the activity of your mast cells, then fewer histamines will be produced and inflammation caused by allergy exposure will be significantly reduced.  I recommend that you take this medication at the same time you take your antihistamine.  I have provided you with a prescription and 5 refills   ProAir, Ventolin, Proventil (albuterol): This inhaled medication contains a short acting beta agonist bronchodilator.  This medication works on the smooth muscle that opens and constricts of your airways by relaxing the muscle.  The result of relaxation of the smooth muscle is increased  air movement and improved work of breathing.  This is a short acting medication that can be used every 4-6 hours as needed for increased work of breathing, shortness of breath, wheezing and excessive coughing.  I have provided you with a prescription and 5 refills.   Trelegy (fluticasone, vilanterol and umeclidinium):  This inhaled medication contains a corticosteroid and long-acting form of albuterol.  The inhaled steroid and this  medication  is not absorbed into the body and will not cause side effects such as increased blood sugar levels, irritability, sleeplessness or weight gain.  Inhaled corticosteroid are sort of like topical steroid creams but, as you can imagine, it is not practical to attempt to rub a steroid cream inside of your lungs.  The long-acting albuterol works similarly to the short acting albuterol found in your rescue inhaler but provides 24-hour relaxation of the smooth muscles that open and constrict your airways; your short acting rescue inhaler can only provide for a few hours this benefit for a few hours.  The third unique ingredient, umeclidinium, is an antimuscarinic and works similarly to your albuterol, providing long-acting relaxation of the smooth muscles in your airway.  Please feel free to continue using your short acting rescue inhaler as often as needed throughout the day for shortness of breath, wheezing, and cough.  I have provided you with a prescription with 5 refills and a coupon.  Please reach out to the pulmonology office that I have included in this checkout sheet that should except your insurance if your insurance was accepted here at this clinic.  It is very important that you have regular follow-up for your asthma and that you take your medication every day.   Please follow-up within the next 7-10 days either with your primary care provider or urgent care if your symptoms do not resolve.     Thank you for visiting urgent care today.  We appreciate the opportunity to participate in your care.       This office note has been dictated using Museum/gallery curator.  Unfortunately, and despite my best efforts, this method of dictation can sometimes lead to occasional typographical or grammatical errors.  I apologize in advance if this occurs.     Lynden Oxford Scales, PA-C 08/21/21 1106

## 2021-08-21 NOTE — Discharge Instructions (Addendum)
I have renewed your prescriptions for albuterol and Singulair.  I changed your prescription for Advair to a different medication called Trelegy that I can help you get for free, I provided you with a coupon.  Please see the list below for recommended medications, dosages and frequencies:    Medrol Dosepak (methylprednisolone): This is a steroid that will significantly calm your upper and lower airways and make your breathing much easier.  Please take one row of tablets daily with your breakfast meal starting tomorrow morning until the prescription is complete.     Singulair (montelukast): This is a mast cell stabilizer that works well with antihistamines.  Mast cells are responsible for stimulating histamine production so you can imagine that if we can reduce the activity of your mast cells, then fewer histamines will be produced and inflammation caused by allergy exposure will be significantly reduced.  I recommend that you take this medication at the same time you take your antihistamine.  I have provided you with a prescription and 5 refills   ProAir, Ventolin, Proventil (albuterol): This inhaled medication contains a short acting beta agonist bronchodilator.  This medication works on the smooth muscle that opens and constricts of your airways by relaxing the muscle.  The result of relaxation of the smooth muscle is increased air movement and improved work of breathing.  This is a short acting medication that can be used every 4-6 hours as needed for increased work of breathing, shortness of breath, wheezing and excessive coughing.  I have provided you with a prescription and 5 refills.   Trelegy (fluticasone, vilanterol and umeclidinium):  This inhaled medication contains a corticosteroid and long-acting form of albuterol.  The inhaled steroid and this medication  is not absorbed into the body and will not cause side effects such as increased blood sugar levels, irritability, sleeplessness or weight  gain.  Inhaled corticosteroid are sort of like topical steroid creams but, as you can imagine, it is not practical to attempt to rub a steroid cream inside of your lungs.  The long-acting albuterol works similarly to the short acting albuterol found in your rescue inhaler but provides 24-hour relaxation of the smooth muscles that open and constrict your airways; your short acting rescue inhaler can only provide for a few hours this benefit for a few hours.  The third unique ingredient, umeclidinium, is an antimuscarinic and works similarly to your albuterol, providing long-acting relaxation of the smooth muscles in your airway.  Please feel free to continue using your short acting rescue inhaler as often as needed throughout the day for shortness of breath, wheezing, and cough.  I have provided you with a prescription with 5 refills and a coupon.  Please reach out to the pulmonology office that I have included in this checkout sheet that should except your insurance if your insurance was accepted here at this clinic.  It is very important that you have regular follow-up for your asthma and that you take your medication every day.   Please follow-up within the next 7-10 days either with your primary care provider or urgent care if your symptoms do not resolve.     Thank you for visiting urgent care today.  We appreciate the opportunity to participate in your care.

## 2021-08-21 NOTE — ED Notes (Signed)
Pt currently being checked in by patient access.  

## 2021-08-28 ENCOUNTER — Encounter: Payer: Self-pay | Admitting: Emergency Medicine

## 2021-08-28 ENCOUNTER — Ambulatory Visit
Admission: EM | Admit: 2021-08-28 | Discharge: 2021-08-28 | Disposition: A | Discharge status: Home / Self Care | Payer: 59 | Attending: Physician Assistant | Admitting: Physician Assistant

## 2021-08-28 ENCOUNTER — Ambulatory Visit (INDEPENDENT_AMBULATORY_CARE_PROVIDER_SITE_OTHER): Payer: 59

## 2021-08-28 DIAGNOSIS — Z23 Encounter for immunization: Secondary | ICD-10-CM

## 2021-08-28 DIAGNOSIS — S61411A Laceration without foreign body of right hand, initial encounter: Secondary | ICD-10-CM | POA: Diagnosis not present

## 2021-08-28 DIAGNOSIS — J4520 Mild intermittent asthma, uncomplicated: Secondary | ICD-10-CM

## 2021-08-28 MED ORDER — IBUPROFEN 400 MG PO TABS: 400.0000 mg | ORAL_TABLET | Freq: Three times a day (TID) | ORAL | 0 refills | Status: DC | PRN

## 2021-08-28 MED ORDER — TRELEGY ELLIPTA 100-62.5-25 MCG/ACT IN AEPB
1.0000 | INHALATION_SPRAY | Freq: Every morning | RESPIRATORY_TRACT | 0 refills | Status: DC
Start: 2021-08-28 — End: 2022-01-30

## 2021-08-28 MED ORDER — TETANUS-DIPHTH-ACELL PERTUSSIS 5-2.5-18.5 LF-MCG/0.5 IM SUSY
0.5000 mL | PREFILLED_SYRINGE | Freq: Once | INTRAMUSCULAR | Status: AC
  Administered 2021-08-28: 0.5 mL via INTRAMUSCULAR

## 2021-08-28 NOTE — ED Provider Notes (Signed)
[facility]    CSN: [id number] Arrival date & time: 08/28/21  1522      History   Chief Complaint Chief Complaint  Patient presents with   Laceration   Immunizations    HPI Jessica Perry is a 41 y.o. female.   Patient presents today with a 30-minute to 1 hour history of right palmar laceration.  She is [place] speaking and video interpreter is utilized during visit.  Reports that she grabbed a metal pole which caused her to cut her hand.  She is right-handed.  She is unsure when her last tetanus is and is interested in updating this today.  She has not cleaned and applied any medication.  Denies any decreased range of motion, numbness, paresthesias.  She is confident that she is not pregnant as she is status post tubal ligation.    Past Medical History:  Diagnosis Date   Gestational diabetes    With first pregnancy   No significant active problems     Patient Active Problem List   Diagnosis Date Noted   Shortness of breath 02/07/2021    Past Surgical History:  Procedure Laterality Date   TUBAL LIGATION  11/22/2011   Procedure: POST PARTUM TUBAL LIGATION;  Surgeon: Loney Laurence, MD;  Location: [facility];  Service: Gynecology;  Laterality: Bilateral;    OB History     Gravida  3   Para  2   Term  2   Preterm  0   AB  1   Living  2      SAB  1   IAB  0   Ectopic  0   Multiple  0   Live Births  2            Home Medications    Prior to Admission medications   Medication Sig Start Date End Date Taking? Authorizing Provider  ibuprofen (ADVIL) 400 MG tablet Take 1 tablet (400 mg total) by mouth every 8 (eight) hours as needed. 08/28/21  Yes Ronaldo Crilly K, PA-C  albuterol (VENTOLIN HFA) 108 (90 Base) MCG/ACT inhaler Inhale 2 puffs into the lungs every 6 (six) hours as needed for wheezing or shortness of breath (as needed, this is a rescue inhaler). 08/21/21   Theadora Rama Scales, PA-C  Fluticasone-Umeclidin-Vilant (TRELEGY ELLIPTA)  100-62.5-25 MCG/ACT AEPB Inhale 1 puff into the lungs in the morning. 08/28/21 02/24/22  Yoneko Talerico, Denny Peon K, PA-C  montelukast (SINGULAIR) 10 MG tablet Take 1 tablet (10 mg total) by mouth at bedtime. 08/21/21 02/17/22  Theadora Rama Scales, PA-C    Family History Family History  Problem Relation Age of Onset   Heart disease Maternal Grandmother    Hypertension Maternal Grandmother    Diabetes Maternal Grandmother     Social History Social History   Tobacco Use   Smoking status: Never   Smokeless tobacco: Never  Substance Use Topics   Alcohol use: No   Drug use: No     Allergies   Patient has no known allergies.   Review of Systems Review of Systems  Musculoskeletal:  Negative for arthralgias and myalgias.  Skin:  Positive for wound. Negative for color change.  Neurological:  Negative for dizziness, weakness, light-headedness, numbness and headaches.     Physical Exam Triage Vital Signs ED Triage Vitals  Enc Vitals Group     BP 08/28/21 1538 108/73     Pulse Rate 08/28/21 1538 79     Resp 08/28/21 1538 20  Temp 08/28/21 1538 98.5 F (36.9 C)     Temp src --      SpO2 08/28/21 1538 98 %     Weight --      Height --      Head Circumference --      Peak Flow --      Pain Score 08/28/21 1541 6     Pain Loc --      Pain Edu? --      Excl. in GC? --    No data found.  Updated Vital Signs BP 108/73   Pulse 79   Temp 98.5 F (36.9 C)   Resp 20   LMP 07/25/2021 (Approximate)   SpO2 98%   Visual Acuity Right Eye Distance:   Left Eye Distance:   Bilateral Distance:    Right Eye Near:   Left Eye Near:    Bilateral Near:     Physical Exam Vitals reviewed.  Constitutional:      General: She is awake. She is not in acute distress.    Appearance: Normal appearance. She is well-developed. She is not ill-appearing.     Comments: Very pleasant female appears stated age in no acute distress  HENT:     Head: Normocephalic and atraumatic.  Cardiovascular:      Rate and Rhythm: Normal rate and regular rhythm.     Heart sounds: Normal heart sounds, S1 normal and S2 normal. No murmur heard.    Comments: Capillary fill within 2 seconds right fingers Pulmonary:     Effort: Pulmonary effort is normal.     Breath sounds: Normal breath sounds. No wheezing, rhonchi or rales.     Comments: Clear to auscultation bilaterally Skin:    Findings: Laceration present.     Comments: 4 cm laceration noted medial right palm without active bleeding.  Normal active range of motion of phalanges and wrist.  Psychiatric:        Behavior: Behavior is cooperative.      UC Treatments / Results  Labs (all labs ordered are listed, but only abnormal results are displayed) Labs Reviewed - No data to display  EKG   Radiology DG Hand 2 View Right  Result Date: 08/28/2021 CLINICAL DATA:  Concern for foreign body EXAM: RIGHT HAND - 2 VIEW COMPARISON:  None Available. FINDINGS: There is no evidence of fracture or dislocation. There is no evidence of arthropathy or other focal bone abnormality. Soft tissues are unremarkable with no evidence of radiopaque foreign body. IMPRESSION: No evidence of radiopaque foreign body. Electronically Signed   By: Allegra Lai M.D.   On: 08/28/2021 15:57    Procedures Laceration Repair  Date/Time: 08/28/2021 4:30 PM  Performed by: Jeani Hawking, PA-C Authorized by: Jeani Hawking, PA-C   Consent:    Consent obtained:  Verbal   Consent given by:  Patient   Risks discussed:  Pain, poor cosmetic result and poor wound healing   Alternatives discussed:  No treatment and delayed treatment Universal protocol:    Procedure explained and questions answered to patient or proxy's satisfaction: yes     Patient identity confirmed:  Verbally with patient Anesthesia:    Anesthesia method:  Local infiltration   Local anesthetic:  Lidocaine 1% w/o epi Laceration details:    Location:  Hand   Hand location:  R palm   Length (cm):  4    Depth (mm):  5 Pre-procedure details:    Preparation:  Patient was prepped and draped  in usual sterile fashion Exploration:    Hemostasis achieved with:  Direct pressure   Imaging obtained: x-ray     Imaging outcome: foreign body not noted     Wound exploration: entire depth of wound visualized     Contaminated: no   Treatment:    Area cleansed with:  Chlorhexidine   Amount of cleaning:  Standard   Irrigation solution:  Tap water   Irrigation volume:  30 mL   Irrigation method:  Syringe   Visualized foreign bodies/material removed: no     Debridement:  None Skin repair:    Repair method:  Sutures   Suture size:  5-0   Suture material:  Prolene   Suture technique:  Simple interrupted   Number of sutures:  5 Approximation:    Approximation:  Close Repair type:    Repair type:  Simple Post-procedure details:    Dressing:  Non-adherent dressing   Procedure completion:  Tolerated  (including critical care time)  Medications Ordered in UC Medications  Tdap (BOOSTRIX) injection 0.5 mL (0.5 mLs Intramuscular Given 08/28/21 1600)    Initial Impression / Assessment and Plan / UC Course  I have reviewed the triage vital signs and the nursing notes.  Pertinent labs & imaging results that were available during my care of the patient were reviewed by me and considered in my medical decision making (see chart for details).     X-ray obtained to rule out retained foreign body which showed no obvious foreign body.  Area was irrigated with tap water and cleaned.  5 sutures placed (see procedure note above).  Tetanus was updated.  Discussed wound care procedures.  She was prescribed ibuprofen for pain.  Discussed that she is to return in 10 days for suture removal but discussed that if anything worsens in the meantime including signs/symptoms of infection she is to be seen immediately.  At the end of the visit, patient requested refill of asthma medication.  Refill of Trelegy inhaler sent  to pharmacy as requested.  Reports she has enough montelukast and albuterol.  Discussed that if she has any worsening symptoms including significant shortness of breath, persistent cough she needs to be seen immediately.  Final Clinical Impressions(s) / UC Diagnoses   Final diagnoses:  Laceration of right hand without foreign body, initial encounter  Mild intermittent asthma without complication     Discharge Instructions      We gave you a tetanus injection today.  We placed 5 sutures today.  Please return in 10 days to have these removed.  If anything worsens and you have redness, swelling, pain you need to be seen immediately.  I called in a refill of your inhaler as requested.     ED Prescriptions     Medication Sig Dispense Auth. Provider   Fluticasone-Umeclidin-Vilant (TRELEGY ELLIPTA) 100-62.5-25 MCG/ACT AEPB Inhale 1 puff into the lungs in the morning. 1 each Keiasha Diep K, PA-C   ibuprofen (ADVIL) 400 MG tablet Take 1 tablet (400 mg total) by mouth every 8 (eight) hours as needed. 30 tablet Esmay Amspacher, Noberto Retort, PA-C      PDMP not reviewed this encounter.   Jeani Hawking, PA-C 08/28/21 [phone]

## 2021-08-28 NOTE — ED Triage Notes (Signed)
Pt here with laceration from piece of metal to right palm and requesting tetanus shot. Actively bleeding in triage.

## 2021-08-28 NOTE — Discharge Instructions (Signed)
We gave you a tetanus injection today.  We placed 5 sutures today.  Please return in 10 days to have these removed.  If anything worsens and you have redness, swelling, pain you need to be seen immediately.  I called in a refill of your inhaler as requested.

## 2021-12-24 DIAGNOSIS — M9903 Segmental and somatic dysfunction of lumbar region: Secondary | ICD-10-CM | POA: Diagnosis not present

## 2021-12-24 DIAGNOSIS — R519 Headache, unspecified: Secondary | ICD-10-CM | POA: Diagnosis not present

## 2021-12-24 DIAGNOSIS — M62830 Muscle spasm of back: Secondary | ICD-10-CM | POA: Diagnosis not present

## 2021-12-24 DIAGNOSIS — M531 Cervicobrachial syndrome: Secondary | ICD-10-CM | POA: Diagnosis not present

## 2021-12-24 DIAGNOSIS — M9902 Segmental and somatic dysfunction of thoracic region: Secondary | ICD-10-CM | POA: Diagnosis not present

## 2021-12-24 DIAGNOSIS — M5386 Other specified dorsopathies, lumbar region: Secondary | ICD-10-CM | POA: Diagnosis not present

## 2021-12-24 DIAGNOSIS — M9901 Segmental and somatic dysfunction of cervical region: Secondary | ICD-10-CM | POA: Diagnosis not present

## 2021-12-24 DIAGNOSIS — M9905 Segmental and somatic dysfunction of pelvic region: Secondary | ICD-10-CM | POA: Diagnosis not present

## 2021-12-26 DIAGNOSIS — M9902 Segmental and somatic dysfunction of thoracic region: Secondary | ICD-10-CM | POA: Diagnosis not present

## 2021-12-26 DIAGNOSIS — M9905 Segmental and somatic dysfunction of pelvic region: Secondary | ICD-10-CM | POA: Diagnosis not present

## 2021-12-26 DIAGNOSIS — M9903 Segmental and somatic dysfunction of lumbar region: Secondary | ICD-10-CM | POA: Diagnosis not present

## 2021-12-26 DIAGNOSIS — R519 Headache, unspecified: Secondary | ICD-10-CM | POA: Diagnosis not present

## 2021-12-26 DIAGNOSIS — M5386 Other specified dorsopathies, lumbar region: Secondary | ICD-10-CM | POA: Diagnosis not present

## 2021-12-26 DIAGNOSIS — M62830 Muscle spasm of back: Secondary | ICD-10-CM | POA: Diagnosis not present

## 2021-12-26 DIAGNOSIS — M531 Cervicobrachial syndrome: Secondary | ICD-10-CM | POA: Diagnosis not present

## 2021-12-26 DIAGNOSIS — M9901 Segmental and somatic dysfunction of cervical region: Secondary | ICD-10-CM | POA: Diagnosis not present

## 2021-12-27 DIAGNOSIS — M9905 Segmental and somatic dysfunction of pelvic region: Secondary | ICD-10-CM | POA: Diagnosis not present

## 2021-12-27 DIAGNOSIS — M9903 Segmental and somatic dysfunction of lumbar region: Secondary | ICD-10-CM | POA: Diagnosis not present

## 2021-12-27 DIAGNOSIS — M9901 Segmental and somatic dysfunction of cervical region: Secondary | ICD-10-CM | POA: Diagnosis not present

## 2021-12-27 DIAGNOSIS — M531 Cervicobrachial syndrome: Secondary | ICD-10-CM | POA: Diagnosis not present

## 2021-12-27 DIAGNOSIS — R519 Headache, unspecified: Secondary | ICD-10-CM | POA: Diagnosis not present

## 2021-12-27 DIAGNOSIS — M62830 Muscle spasm of back: Secondary | ICD-10-CM | POA: Diagnosis not present

## 2021-12-27 DIAGNOSIS — M5386 Other specified dorsopathies, lumbar region: Secondary | ICD-10-CM | POA: Diagnosis not present

## 2021-12-27 DIAGNOSIS — M9902 Segmental and somatic dysfunction of thoracic region: Secondary | ICD-10-CM | POA: Diagnosis not present

## 2021-12-31 DIAGNOSIS — M9901 Segmental and somatic dysfunction of cervical region: Secondary | ICD-10-CM | POA: Diagnosis not present

## 2021-12-31 DIAGNOSIS — M9905 Segmental and somatic dysfunction of pelvic region: Secondary | ICD-10-CM | POA: Diagnosis not present

## 2021-12-31 DIAGNOSIS — M62830 Muscle spasm of back: Secondary | ICD-10-CM | POA: Diagnosis not present

## 2021-12-31 DIAGNOSIS — M5386 Other specified dorsopathies, lumbar region: Secondary | ICD-10-CM | POA: Diagnosis not present

## 2021-12-31 DIAGNOSIS — M531 Cervicobrachial syndrome: Secondary | ICD-10-CM | POA: Diagnosis not present

## 2021-12-31 DIAGNOSIS — M9903 Segmental and somatic dysfunction of lumbar region: Secondary | ICD-10-CM | POA: Diagnosis not present

## 2021-12-31 DIAGNOSIS — M9902 Segmental and somatic dysfunction of thoracic region: Secondary | ICD-10-CM | POA: Diagnosis not present

## 2021-12-31 DIAGNOSIS — R519 Headache, unspecified: Secondary | ICD-10-CM | POA: Diagnosis not present

## 2022-01-02 DIAGNOSIS — M5386 Other specified dorsopathies, lumbar region: Secondary | ICD-10-CM | POA: Diagnosis not present

## 2022-01-02 DIAGNOSIS — M9901 Segmental and somatic dysfunction of cervical region: Secondary | ICD-10-CM | POA: Diagnosis not present

## 2022-01-02 DIAGNOSIS — R519 Headache, unspecified: Secondary | ICD-10-CM | POA: Diagnosis not present

## 2022-01-02 DIAGNOSIS — M9902 Segmental and somatic dysfunction of thoracic region: Secondary | ICD-10-CM | POA: Diagnosis not present

## 2022-01-02 DIAGNOSIS — M9903 Segmental and somatic dysfunction of lumbar region: Secondary | ICD-10-CM | POA: Diagnosis not present

## 2022-01-02 DIAGNOSIS — M9905 Segmental and somatic dysfunction of pelvic region: Secondary | ICD-10-CM | POA: Diagnosis not present

## 2022-01-02 DIAGNOSIS — M62830 Muscle spasm of back: Secondary | ICD-10-CM | POA: Diagnosis not present

## 2022-01-02 DIAGNOSIS — M531 Cervicobrachial syndrome: Secondary | ICD-10-CM | POA: Diagnosis not present

## 2022-01-03 DIAGNOSIS — M9902 Segmental and somatic dysfunction of thoracic region: Secondary | ICD-10-CM | POA: Diagnosis not present

## 2022-01-03 DIAGNOSIS — M9903 Segmental and somatic dysfunction of lumbar region: Secondary | ICD-10-CM | POA: Diagnosis not present

## 2022-01-03 DIAGNOSIS — R519 Headache, unspecified: Secondary | ICD-10-CM | POA: Diagnosis not present

## 2022-01-03 DIAGNOSIS — M5386 Other specified dorsopathies, lumbar region: Secondary | ICD-10-CM | POA: Diagnosis not present

## 2022-01-03 DIAGNOSIS — M9901 Segmental and somatic dysfunction of cervical region: Secondary | ICD-10-CM | POA: Diagnosis not present

## 2022-01-03 DIAGNOSIS — M9905 Segmental and somatic dysfunction of pelvic region: Secondary | ICD-10-CM | POA: Diagnosis not present

## 2022-01-03 DIAGNOSIS — M531 Cervicobrachial syndrome: Secondary | ICD-10-CM | POA: Diagnosis not present

## 2022-01-03 DIAGNOSIS — M62830 Muscle spasm of back: Secondary | ICD-10-CM | POA: Diagnosis not present

## 2022-01-07 DIAGNOSIS — M531 Cervicobrachial syndrome: Secondary | ICD-10-CM | POA: Diagnosis not present

## 2022-01-07 DIAGNOSIS — M9905 Segmental and somatic dysfunction of pelvic region: Secondary | ICD-10-CM | POA: Diagnosis not present

## 2022-01-07 DIAGNOSIS — R519 Headache, unspecified: Secondary | ICD-10-CM | POA: Diagnosis not present

## 2022-01-07 DIAGNOSIS — M9901 Segmental and somatic dysfunction of cervical region: Secondary | ICD-10-CM | POA: Diagnosis not present

## 2022-01-07 DIAGNOSIS — M9903 Segmental and somatic dysfunction of lumbar region: Secondary | ICD-10-CM | POA: Diagnosis not present

## 2022-01-07 DIAGNOSIS — M5386 Other specified dorsopathies, lumbar region: Secondary | ICD-10-CM | POA: Diagnosis not present

## 2022-01-07 DIAGNOSIS — M9902 Segmental and somatic dysfunction of thoracic region: Secondary | ICD-10-CM | POA: Diagnosis not present

## 2022-01-07 DIAGNOSIS — M62830 Muscle spasm of back: Secondary | ICD-10-CM | POA: Diagnosis not present

## 2022-01-10 DIAGNOSIS — M9905 Segmental and somatic dysfunction of pelvic region: Secondary | ICD-10-CM | POA: Diagnosis not present

## 2022-01-10 DIAGNOSIS — R519 Headache, unspecified: Secondary | ICD-10-CM | POA: Diagnosis not present

## 2022-01-10 DIAGNOSIS — M9902 Segmental and somatic dysfunction of thoracic region: Secondary | ICD-10-CM | POA: Diagnosis not present

## 2022-01-10 DIAGNOSIS — M9903 Segmental and somatic dysfunction of lumbar region: Secondary | ICD-10-CM | POA: Diagnosis not present

## 2022-01-10 DIAGNOSIS — M5386 Other specified dorsopathies, lumbar region: Secondary | ICD-10-CM | POA: Diagnosis not present

## 2022-01-10 DIAGNOSIS — M9901 Segmental and somatic dysfunction of cervical region: Secondary | ICD-10-CM | POA: Diagnosis not present

## 2022-01-10 DIAGNOSIS — M62830 Muscle spasm of back: Secondary | ICD-10-CM | POA: Diagnosis not present

## 2022-01-10 DIAGNOSIS — M531 Cervicobrachial syndrome: Secondary | ICD-10-CM | POA: Diagnosis not present

## 2022-01-14 DIAGNOSIS — M531 Cervicobrachial syndrome: Secondary | ICD-10-CM | POA: Diagnosis not present

## 2022-01-14 DIAGNOSIS — M5386 Other specified dorsopathies, lumbar region: Secondary | ICD-10-CM | POA: Diagnosis not present

## 2022-01-14 DIAGNOSIS — M9902 Segmental and somatic dysfunction of thoracic region: Secondary | ICD-10-CM | POA: Diagnosis not present

## 2022-01-14 DIAGNOSIS — M9901 Segmental and somatic dysfunction of cervical region: Secondary | ICD-10-CM | POA: Diagnosis not present

## 2022-01-14 DIAGNOSIS — M9903 Segmental and somatic dysfunction of lumbar region: Secondary | ICD-10-CM | POA: Diagnosis not present

## 2022-01-14 DIAGNOSIS — R519 Headache, unspecified: Secondary | ICD-10-CM | POA: Diagnosis not present

## 2022-01-14 DIAGNOSIS — M9905 Segmental and somatic dysfunction of pelvic region: Secondary | ICD-10-CM | POA: Diagnosis not present

## 2022-01-14 DIAGNOSIS — M62830 Muscle spasm of back: Secondary | ICD-10-CM | POA: Diagnosis not present

## 2022-01-16 DIAGNOSIS — M62830 Muscle spasm of back: Secondary | ICD-10-CM | POA: Diagnosis not present

## 2022-01-16 DIAGNOSIS — M9903 Segmental and somatic dysfunction of lumbar region: Secondary | ICD-10-CM | POA: Diagnosis not present

## 2022-01-16 DIAGNOSIS — M9905 Segmental and somatic dysfunction of pelvic region: Secondary | ICD-10-CM | POA: Diagnosis not present

## 2022-01-16 DIAGNOSIS — R519 Headache, unspecified: Secondary | ICD-10-CM | POA: Diagnosis not present

## 2022-01-16 DIAGNOSIS — M5386 Other specified dorsopathies, lumbar region: Secondary | ICD-10-CM | POA: Diagnosis not present

## 2022-01-16 DIAGNOSIS — M9901 Segmental and somatic dysfunction of cervical region: Secondary | ICD-10-CM | POA: Diagnosis not present

## 2022-01-16 DIAGNOSIS — M531 Cervicobrachial syndrome: Secondary | ICD-10-CM | POA: Diagnosis not present

## 2022-01-16 DIAGNOSIS — M9902 Segmental and somatic dysfunction of thoracic region: Secondary | ICD-10-CM | POA: Diagnosis not present

## 2022-01-21 DIAGNOSIS — M9905 Segmental and somatic dysfunction of pelvic region: Secondary | ICD-10-CM | POA: Diagnosis not present

## 2022-01-21 DIAGNOSIS — M9901 Segmental and somatic dysfunction of cervical region: Secondary | ICD-10-CM | POA: Diagnosis not present

## 2022-01-21 DIAGNOSIS — M531 Cervicobrachial syndrome: Secondary | ICD-10-CM | POA: Diagnosis not present

## 2022-01-21 DIAGNOSIS — M62830 Muscle spasm of back: Secondary | ICD-10-CM | POA: Diagnosis not present

## 2022-01-21 DIAGNOSIS — M9903 Segmental and somatic dysfunction of lumbar region: Secondary | ICD-10-CM | POA: Diagnosis not present

## 2022-01-21 DIAGNOSIS — M5386 Other specified dorsopathies, lumbar region: Secondary | ICD-10-CM | POA: Diagnosis not present

## 2022-01-21 DIAGNOSIS — M9902 Segmental and somatic dysfunction of thoracic region: Secondary | ICD-10-CM | POA: Diagnosis not present

## 2022-01-21 DIAGNOSIS — R519 Headache, unspecified: Secondary | ICD-10-CM | POA: Diagnosis not present

## 2022-01-30 ENCOUNTER — Ambulatory Visit (INDEPENDENT_AMBULATORY_CARE_PROVIDER_SITE_OTHER): Payer: 59 | Admitting: Nurse Practitioner

## 2022-01-30 ENCOUNTER — Encounter: Payer: Self-pay | Admitting: Nurse Practitioner

## 2022-01-30 VITALS — BP 105/75 | HR 80 | Temp 98.2°F | Ht 64.0 in | Wt 138.0 lb

## 2022-01-30 DIAGNOSIS — F419 Anxiety disorder, unspecified: Secondary | ICD-10-CM

## 2022-01-30 DIAGNOSIS — F32A Depression, unspecified: Secondary | ICD-10-CM | POA: Insufficient documentation

## 2022-01-30 DIAGNOSIS — Z1159 Encounter for screening for other viral diseases: Secondary | ICD-10-CM | POA: Diagnosis not present

## 2022-01-30 DIAGNOSIS — J4540 Moderate persistent asthma, uncomplicated: Secondary | ICD-10-CM | POA: Diagnosis not present

## 2022-01-30 DIAGNOSIS — Z13220 Encounter for screening for lipoid disorders: Secondary | ICD-10-CM

## 2022-01-30 DIAGNOSIS — M542 Cervicalgia: Secondary | ICD-10-CM | POA: Diagnosis not present

## 2022-01-30 DIAGNOSIS — R69 Illness, unspecified: Secondary | ICD-10-CM | POA: Diagnosis not present

## 2022-01-30 MED ORDER — CYCLOBENZAPRINE HCL 10 MG PO TABS: 10.0000 mg | ORAL_TABLET | Freq: Three times a day (TID) | ORAL | 0 refills | Status: DC | PRN

## 2022-01-30 MED ORDER — TRELEGY ELLIPTA 100-62.5-25 MCG/ACT IN AEPB
1.0000 | INHALATION_SPRAY | Freq: Every morning | RESPIRATORY_TRACT | 0 refills | Status: AC
Start: 2022-01-30 — End: 2022-07-29

## 2022-01-30 MED ORDER — ALBUTEROL SULFATE HFA 108 (90 BASE) MCG/ACT IN AERS: 2.0000 | INHALATION_SPRAY | Freq: Four times a day (QID) | RESPIRATORY_TRACT | 5 refills | Status: DC | PRN

## 2022-01-30 MED ORDER — SERTRALINE HCL 25 MG PO TABS: 25.0000 mg | ORAL_TABLET | Freq: Every day | ORAL | 1 refills | Status: DC

## 2022-01-30 MED ORDER — MELOXICAM 15 MG PO TABS: 15.0000 mg | ORAL_TABLET | Freq: Every day | ORAL | 0 refills | Status: DC | PRN

## 2022-01-30 NOTE — Assessment & Plan Note (Signed)
She has been experiencing some depression and anxiety, along with some trouble sleeping for the last year.  She is tearful on exam today.  Her PHQ-9 is a 21 and her GAD-7 is a 14.  She denies SI/HI.  Will check CMP, CBC, TSH today.  Will have her start sertraline 25 mg daily.  Discussed possible side effects.  She declines referral to therapy at this point.  Follow-up in 4 to 6 weeks.

## 2022-01-30 NOTE — Assessment & Plan Note (Signed)
Chronic, ongoing.  She has been experiencing neck pain for the last few months.  She went to a chiropractor and had x-rays who said that she had some straightening of her cervical spine.  She can start meloxicam 15 mg daily as needed for pain.  Will also give her Flexeril 10 mg as needed for muscle spasms and tightness.  Stretches given for her to do daily.  She could also use heat or ice.  Follow-up in 4 to 6 weeks.

## 2022-01-30 NOTE — Patient Instructions (Addendum)
It was great to see you!  Start zoloft 1 tablet daily to help with your mood and sleeping.   Start meloxicam once a day with food as needed for pain.  Start flexeril 1 tablet 3 times a day as needed for muscle tightness. I have also attached some exercises to do daily for your neck.   We are checking your labs today and will let you know the results via mychart/phone.   Let's follow-up in 4-6 weeks, sooner if you have concerns.  If a referral was placed today, you will be contacted for an appointment. Please note that routine referrals can sometimes take up to 3-4 weeks to process. Please call our office if you haven't heard anything after this time frame.  Take care,  Rodman Pickle, NP

## 2022-01-30 NOTE — Assessment & Plan Note (Signed)
She has a history of asthma, however states that her symptoms were worse when she was working.  Now that she is at home, her symptoms are mostly improved.  She uses the Trelegy and albuterol as needed.  Refill sent to the pharmacy.  Follow-up if symptoms worsen or with any concerns.

## 2022-01-30 NOTE — Progress Notes (Signed)
New Patient Visit  BP 105/75 (BP Location: Left Arm, Patient Position: Sitting, Cuff Size: Normal)   Pulse 80   Temp 98.2 F (36.8 C) (Temporal)   Ht 5\' 4"  (1.626 m)   Wt 138 lb (62.6 kg)   SpO2 98%   BMI 23.69 kg/m    Subjective:    Patient ID: , female    DOB: March 24, 1980, 41 y.o.   MRN: [id number]  CC: Chief Complaint  Patient presents with   Establish Care    Est care pt c/o neck pain fatigue and not able to sleep x3 month.    HPI: Jessica Perry is a 41 y.o. female presents for new patient visit to establish care.  Introduced to 41 role and practice setting.  All questions answered.  Discussed provider/patient relationship and expectations.  She has been having neck pain for about 3 months. She went to a chiropractor and had an a Xray which showed straigthening of cervical spine. She states that laying down makes the pain worse. She has not tried any other treatments at home. She denies injury. Her shoulders feel tight and tense. Denies radiation of pain.   She has been having trouble sleeping for about 1 year. She gets between 3-6 hours of sleep. She has tried melatonin which gives her a headache in the morning. She feels fatigued during day. She does not drink caffeine or exericse. She has also been experiencing some depression and anxiety. She gets tearful at times. This interferes with her every day life.      01/30/2022    2:45 PM 01/30/2022    2:00 PM 01/30/2018   11:51 AM  Depression screen PHQ 2/9  Decreased Interest 1 3 0  Down, Depressed, Hopeless 3 3 0  PHQ - 2 Score 4 6 0  Altered sleeping 3    Tired, decreased energy 3    Change in appetite 3    Feeling bad or failure about yourself  3    Trouble concentrating 2    Moving slowly or fidgety/restless 3    Suicidal thoughts 0    PHQ-9 Score 21    Difficult doing work/chores Somewhat difficult        01/30/2022    2:46 PM  GAD 7 : Generalized Anxiety Score  Nervous, Anxious, on Edge  3  Control/stop worrying 1  Worry too much - different things 2  Trouble relaxing 2  Restless 2  Easily annoyed or irritable 2  Afraid - awful might happen 2  Total GAD 7 Score 14  Anxiety Difficulty Somewhat difficult     Past Medical History:  Diagnosis Date   Asthma    Gestational diabetes     Past Surgical History:  Procedure Laterality Date   TUBAL LIGATION  11/22/2011   Procedure: POST PARTUM TUBAL LIGATION;  Surgeon: 11/24/2011, MD;  Location: [facility];  Service: Gynecology;  Laterality: Bilateral;    Family History  Problem Relation Age of Onset   Healthy Mother    Healthy Father    Heart disease Maternal Grandmother    Hypertension Maternal Grandmother    Diabetes Maternal Grandmother      Social History   Tobacco Use   Smoking status: Never   Smokeless tobacco: Never  Vaping Use   Vaping Use: Never used  Substance Use Topics   Alcohol use: Never   Drug use: Not Currently    No current outpatient medications on file prior to visit.  No current facility-administered medications on file prior to visit.     Review of Systems  Constitutional:  Positive for fatigue. Negative for fever.  HENT: Negative.    Eyes: Negative.   Respiratory: Negative.    Cardiovascular: Negative.   Gastrointestinal: Negative.   Genitourinary: Negative.   Musculoskeletal:  Positive for back pain and neck pain.  Skin: Negative.   Neurological: Negative.   Psychiatric/Behavioral:  Positive for dysphoric mood and sleep disturbance. The patient is nervous/anxious.       Objective:    BP 105/75 (BP Location: Left Arm, Patient Position: Sitting, Cuff Size: Normal)   Pulse 80   Temp 98.2 F (36.8 C) (Temporal)   Ht 5\' 4"  (1.626 m)   Wt 138 lb (62.6 kg)   SpO2 98%   BMI 23.69 kg/m   Wt Readings from Last 3 Encounters:  01/30/22 138 lb (62.6 kg)  02/07/21 137 lb 9.6 oz (62.4 kg)  06/25/20 136 lb 11 oz (62 kg)    BP Readings from Last 3 Encounters:  01/30/22  105/75  08/28/21 108/73  08/21/21 105/72    Physical Exam Vitals and nursing note reviewed.  Constitutional:      General: She is not in acute distress.    Appearance: Normal appearance.  HENT:     Head: Normocephalic and atraumatic.     Right Ear: Tympanic membrane, ear canal and external ear normal.     Left Ear: Tympanic membrane, ear canal and external ear normal.  Eyes:     Conjunctiva/sclera: Conjunctivae normal.  Cardiovascular:     Rate and Rhythm: Normal rate and regular rhythm.     Pulses: Normal pulses.     Heart sounds: Normal heart sounds.  Pulmonary:     Effort: Pulmonary effort is normal.     Breath sounds: Normal breath sounds.  Abdominal:     Palpations: Abdomen is soft.     Tenderness: There is no abdominal tenderness.  Musculoskeletal:        General: Normal range of motion.     Cervical back: Normal range of motion and neck supple.     Right lower leg: No edema.     Left lower leg: No edema.  Lymphadenopathy:     Cervical: No cervical adenopathy.  Skin:    General: Skin is warm and dry.  Neurological:     General: No focal deficit present.     Mental Status: She is alert and oriented to person, place, and time.     Cranial Nerves: No cranial nerve deficit.     Coordination: Coordination normal.     Gait: Gait normal.  Psychiatric:        Mood and Affect: Mood normal.        Behavior: Behavior normal.        Thought Content: Thought content normal.        Judgment: Judgment normal.        Assessment & Plan:   Problem List Items Addressed This Visit       Respiratory   Moderate persistent asthma without complication    She has a history of asthma, however states that her symptoms were worse when she was working.  Now that she is at home, her symptoms are mostly improved.  She uses the Trelegy and albuterol as needed.  Refill sent to the pharmacy.  Follow-up if symptoms worsen or with any concerns.      Relevant Medications   albuterol  (VENTOLIN HFA)  108 (90 Base) MCG/ACT inhaler   Fluticasone-Umeclidin-Vilant (TRELEGY ELLIPTA) 100-62.5-25 MCG/ACT AEPB     Other   Anxiety and depression - Primary    She has been experiencing some depression and anxiety, along with some trouble sleeping for the last year.  She is tearful on exam today.  Her PHQ-9 is a 21 and her GAD-7 is a 14.  She denies SI/HI.  Will check CMP, CBC, TSH today.  Will have her start sertraline 25 mg daily.  Discussed possible side effects.  She declines referral to therapy at this point.  Follow-up in 4 to 6 weeks.      Relevant Medications   sertraline (ZOLOFT) 25 MG tablet   Other Relevant Orders   CBC   Comprehensive metabolic panel   TSH   Neck pain    Chronic, ongoing.  She has been experiencing neck pain for the last few months.  She went to a chiropractor and had x-rays who said that she had some straightening of her cervical spine.  She can start meloxicam 15 mg daily as needed for pain.  Will also give her Flexeril 10 mg as needed for muscle spasms and tightness.  Stretches given for her to do daily.  She could also use heat or ice.  Follow-up in 4 to 6 weeks.      Other Visit Diagnoses     Screening, lipid       Screen lipid panel today   Relevant Orders   Lipid panel   Encounter for hepatitis C screening test for low risk patient       Screen for hepatitis C today.   Relevant Orders   Hepatitis C antibody        Follow up plan: Return in about 4 weeks (around 02/27/2022) for 4-6 weeks depression, anxiety, sleep.

## 2022-02-07 DIAGNOSIS — M9902 Segmental and somatic dysfunction of thoracic region: Secondary | ICD-10-CM | POA: Diagnosis not present

## 2022-02-07 DIAGNOSIS — M9901 Segmental and somatic dysfunction of cervical region: Secondary | ICD-10-CM | POA: Diagnosis not present

## 2022-02-07 DIAGNOSIS — M5386 Other specified dorsopathies, lumbar region: Secondary | ICD-10-CM | POA: Diagnosis not present

## 2022-02-07 DIAGNOSIS — M62830 Muscle spasm of back: Secondary | ICD-10-CM | POA: Diagnosis not present

## 2022-02-07 DIAGNOSIS — M531 Cervicobrachial syndrome: Secondary | ICD-10-CM | POA: Diagnosis not present

## 2022-02-07 DIAGNOSIS — M9905 Segmental and somatic dysfunction of pelvic region: Secondary | ICD-10-CM | POA: Diagnosis not present

## 2022-02-07 DIAGNOSIS — M9903 Segmental and somatic dysfunction of lumbar region: Secondary | ICD-10-CM | POA: Diagnosis not present

## 2022-02-07 DIAGNOSIS — R519 Headache, unspecified: Secondary | ICD-10-CM | POA: Diagnosis not present

## 2022-02-21 DIAGNOSIS — M5386 Other specified dorsopathies, lumbar region: Secondary | ICD-10-CM | POA: Diagnosis not present

## 2022-02-21 DIAGNOSIS — M9903 Segmental and somatic dysfunction of lumbar region: Secondary | ICD-10-CM | POA: Diagnosis not present

## 2022-02-21 DIAGNOSIS — M62830 Muscle spasm of back: Secondary | ICD-10-CM | POA: Diagnosis not present

## 2022-02-21 DIAGNOSIS — R519 Headache, unspecified: Secondary | ICD-10-CM | POA: Diagnosis not present

## 2022-02-21 DIAGNOSIS — M9901 Segmental and somatic dysfunction of cervical region: Secondary | ICD-10-CM | POA: Diagnosis not present

## 2022-02-21 DIAGNOSIS — M9905 Segmental and somatic dysfunction of pelvic region: Secondary | ICD-10-CM | POA: Diagnosis not present

## 2022-02-21 DIAGNOSIS — M9902 Segmental and somatic dysfunction of thoracic region: Secondary | ICD-10-CM | POA: Diagnosis not present

## 2022-02-21 DIAGNOSIS — M531 Cervicobrachial syndrome: Secondary | ICD-10-CM | POA: Diagnosis not present

## 2022-02-26 ENCOUNTER — Other Ambulatory Visit: Payer: Self-pay | Admitting: Nurse Practitioner

## 2022-02-27 ENCOUNTER — Ambulatory Visit: Payer: 59 | Admitting: Nurse Practitioner

## 2022-02-27 ENCOUNTER — Telehealth: Payer: Self-pay | Admitting: Nurse Practitioner

## 2022-02-27 NOTE — Telephone Encounter (Signed)
Pt was  a no show 12/20 for an OV with Lauren. This is her first, letter sent

## 2022-03-07 DIAGNOSIS — M9905 Segmental and somatic dysfunction of pelvic region: Secondary | ICD-10-CM | POA: Diagnosis not present

## 2022-03-07 DIAGNOSIS — M531 Cervicobrachial syndrome: Secondary | ICD-10-CM | POA: Diagnosis not present

## 2022-03-07 DIAGNOSIS — M9903 Segmental and somatic dysfunction of lumbar region: Secondary | ICD-10-CM | POA: Diagnosis not present

## 2022-03-07 DIAGNOSIS — M5386 Other specified dorsopathies, lumbar region: Secondary | ICD-10-CM | POA: Diagnosis not present

## 2022-03-07 DIAGNOSIS — R519 Headache, unspecified: Secondary | ICD-10-CM | POA: Diagnosis not present

## 2022-03-07 DIAGNOSIS — M9901 Segmental and somatic dysfunction of cervical region: Secondary | ICD-10-CM | POA: Diagnosis not present

## 2022-03-07 DIAGNOSIS — M9902 Segmental and somatic dysfunction of thoracic region: Secondary | ICD-10-CM | POA: Diagnosis not present

## 2022-03-07 DIAGNOSIS — M62830 Muscle spasm of back: Secondary | ICD-10-CM | POA: Diagnosis not present

## 2022-03-13 NOTE — Telephone Encounter (Signed)
1st no show, fee waived, letter sent 

## 2022-03-13 NOTE — Telephone Encounter (Signed)
Noted  

## 2022-03-27 DIAGNOSIS — M9902 Segmental and somatic dysfunction of thoracic region: Secondary | ICD-10-CM | POA: Diagnosis not present

## 2022-03-27 DIAGNOSIS — M9903 Segmental and somatic dysfunction of lumbar region: Secondary | ICD-10-CM | POA: Diagnosis not present

## 2022-03-27 DIAGNOSIS — M62830 Muscle spasm of back: Secondary | ICD-10-CM | POA: Diagnosis not present

## 2022-03-27 DIAGNOSIS — M9905 Segmental and somatic dysfunction of pelvic region: Secondary | ICD-10-CM | POA: Diagnosis not present

## 2022-03-27 DIAGNOSIS — R519 Headache, unspecified: Secondary | ICD-10-CM | POA: Diagnosis not present

## 2022-03-27 DIAGNOSIS — M9901 Segmental and somatic dysfunction of cervical region: Secondary | ICD-10-CM | POA: Diagnosis not present

## 2022-03-27 DIAGNOSIS — M531 Cervicobrachial syndrome: Secondary | ICD-10-CM | POA: Diagnosis not present

## 2022-03-27 DIAGNOSIS — M5386 Other specified dorsopathies, lumbar region: Secondary | ICD-10-CM | POA: Diagnosis not present

## 2022-04-09 DIAGNOSIS — R519 Headache, unspecified: Secondary | ICD-10-CM | POA: Diagnosis not present

## 2022-04-09 DIAGNOSIS — M9905 Segmental and somatic dysfunction of pelvic region: Secondary | ICD-10-CM | POA: Diagnosis not present

## 2022-04-09 DIAGNOSIS — M5386 Other specified dorsopathies, lumbar region: Secondary | ICD-10-CM | POA: Diagnosis not present

## 2022-04-09 DIAGNOSIS — M9903 Segmental and somatic dysfunction of lumbar region: Secondary | ICD-10-CM | POA: Diagnosis not present

## 2022-04-09 DIAGNOSIS — M531 Cervicobrachial syndrome: Secondary | ICD-10-CM | POA: Diagnosis not present

## 2022-04-09 DIAGNOSIS — M62830 Muscle spasm of back: Secondary | ICD-10-CM | POA: Diagnosis not present

## 2022-04-09 DIAGNOSIS — M9902 Segmental and somatic dysfunction of thoracic region: Secondary | ICD-10-CM | POA: Diagnosis not present

## 2022-04-09 DIAGNOSIS — M9901 Segmental and somatic dysfunction of cervical region: Secondary | ICD-10-CM | POA: Diagnosis not present

## 2022-04-09 IMAGING — CT CT HEAD W/O CM
4 series · 15 of 47 positions shown, 17 images · non-contrast
Comparison: 11/05/2009

CLINICAL DATA: Dizziness

EXAM:
CT HEAD WITHOUT CONTRAST
TECHNIQUE: Contiguous axial images were obtained from the base of the skull
through the vertex without intravenous contrast.

[Series 3: head without · axial · non-contrast · 0.44mm/px · z∈[-163,-43]mm · 7 of 34 slices shown, 9 images]
[im 5/34  brain]
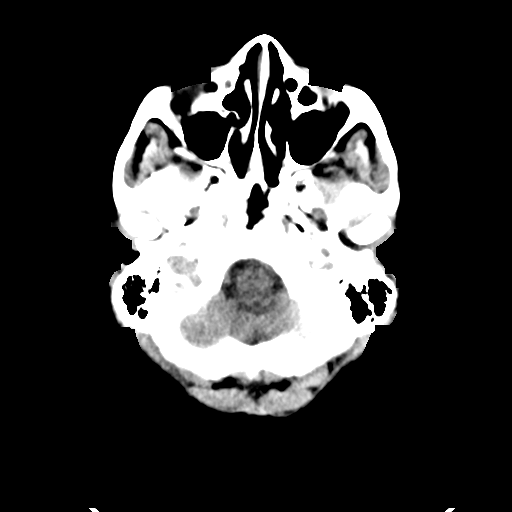
[im 5/34  bone]
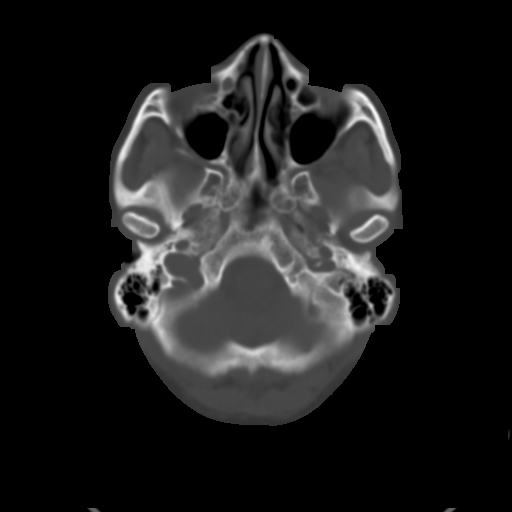
[im 9/34  brain]
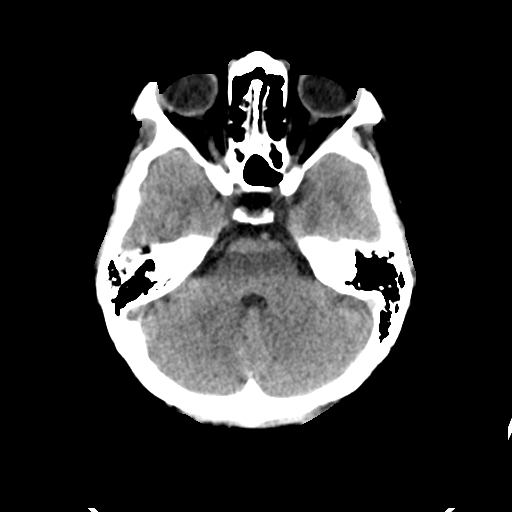
[im 13/34  brain]
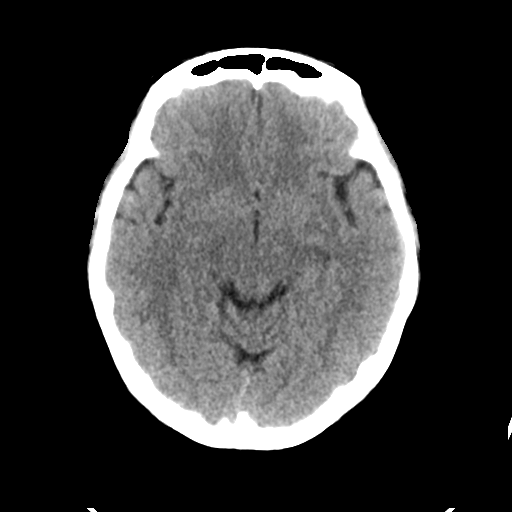
[im 17/34  brain]
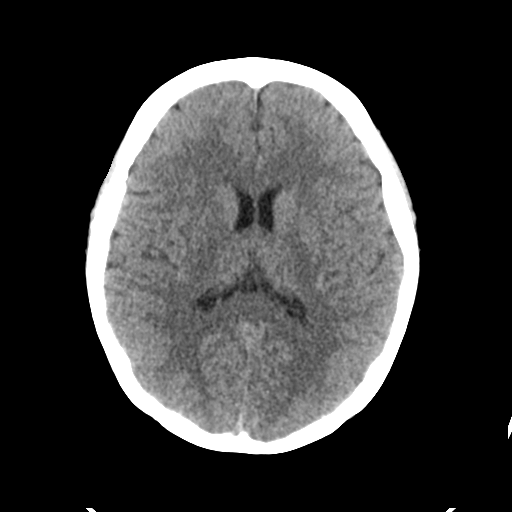
[im 21/34  brain]
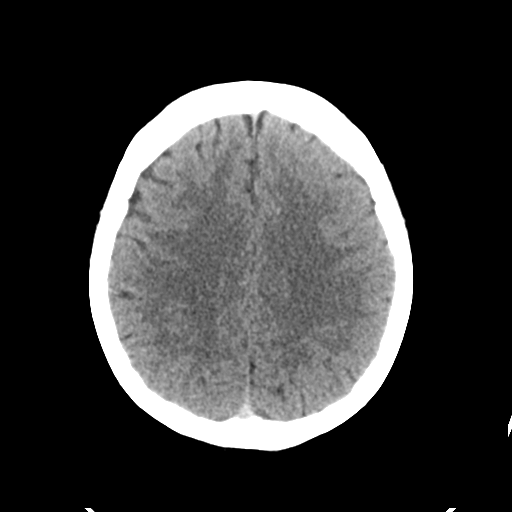
[im 21/34  bone]
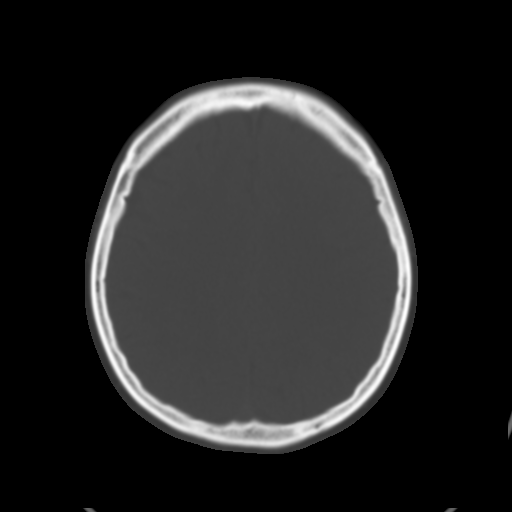
[im 25/34  brain]
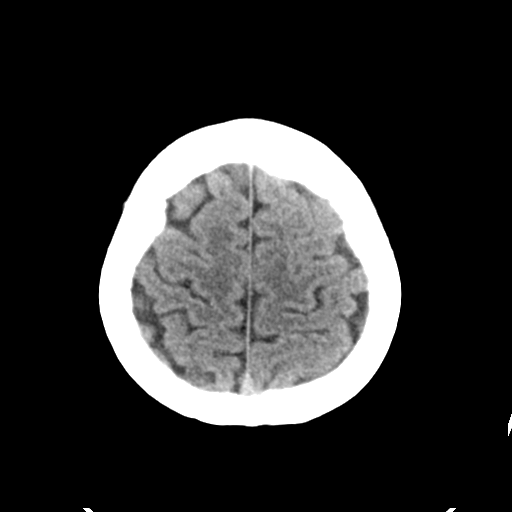
[im 29/34  brain]
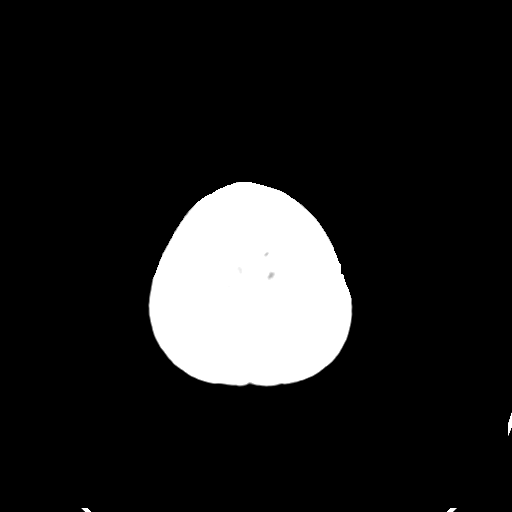

[Series 4: head bone · axial · 0.44mm/px · z∈[-167,-151]mm · 2 of 83 slices shown]
[im 9/83  bone]
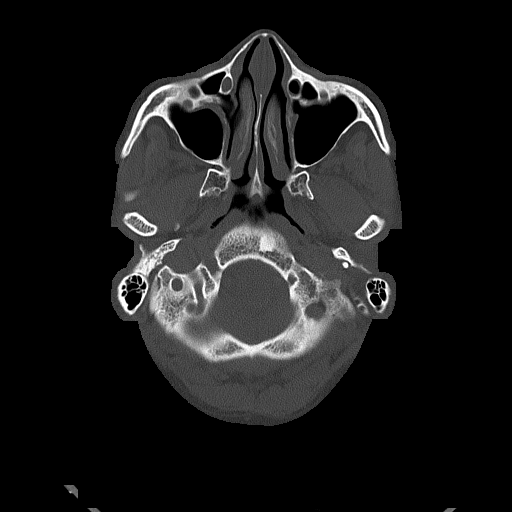
[im 17/83  bone]
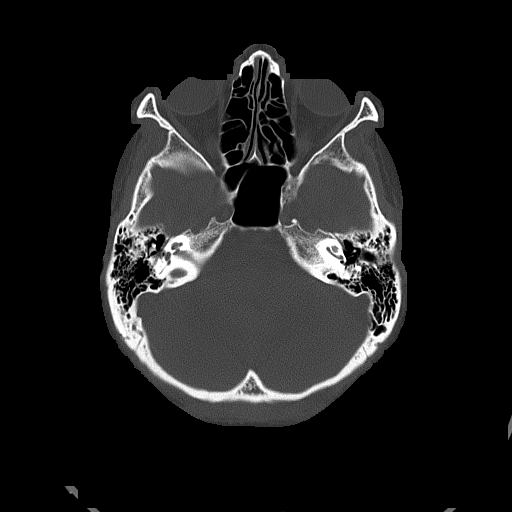

[Series 5: head without cor · coronal · non-contrast · 0.32mm/px · 3 of 69 slices shown]
[im 23/69  brain]
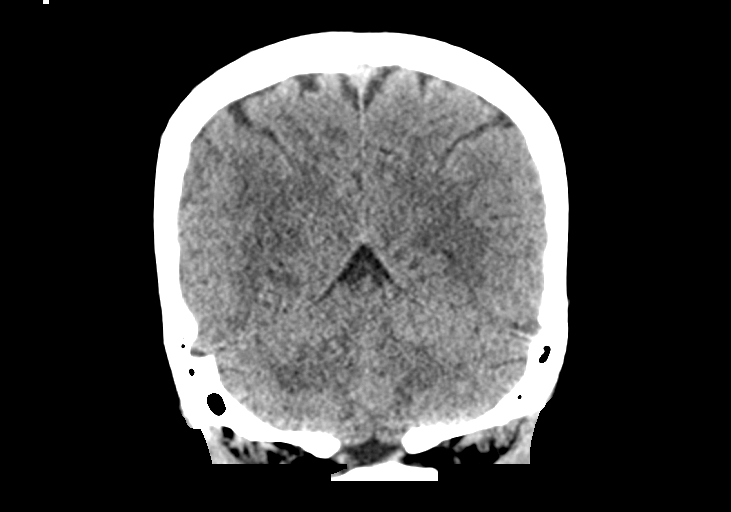
[im 31/69  brain]
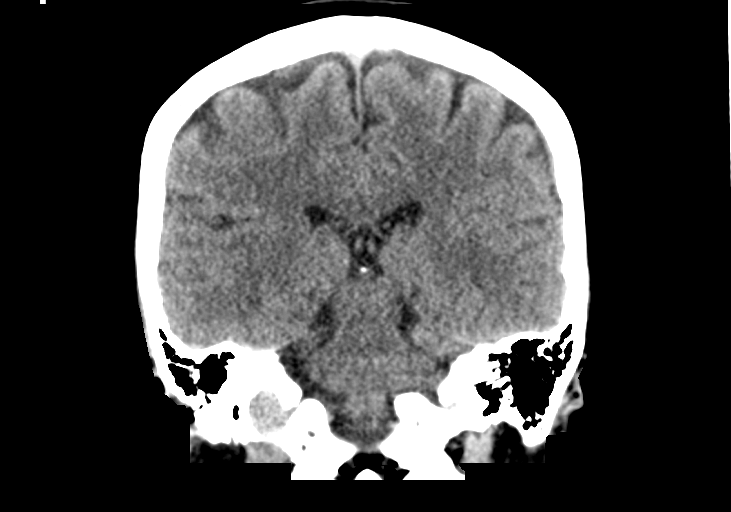
[im 38/69  brain]
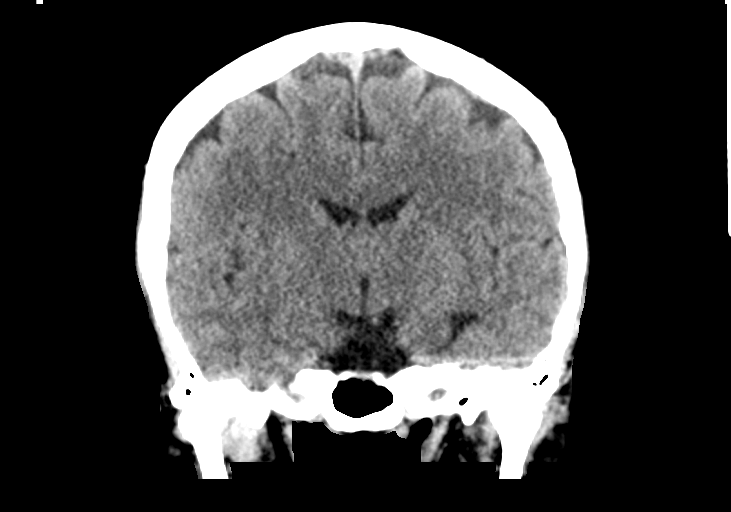

[Series 6: head without sag · sagittal · non-contrast · 0.32mm/px · 3 of 67 slices shown]
[im 23/67  brain]
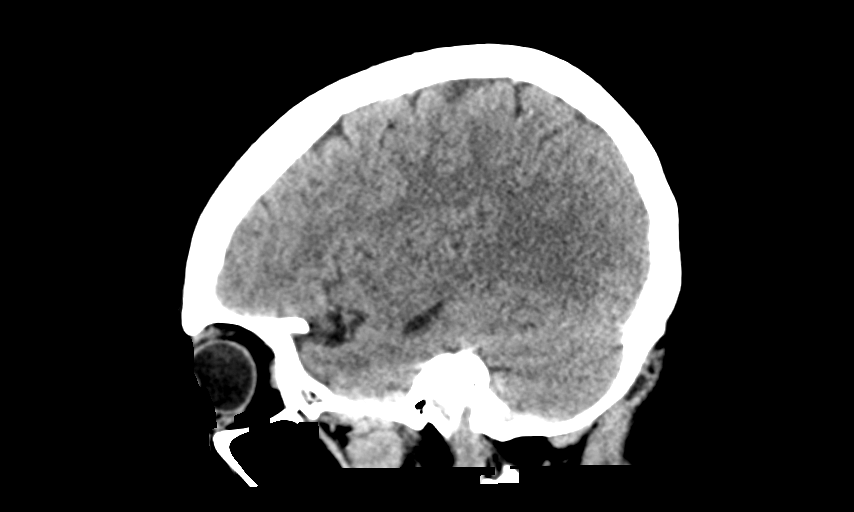
[im 34/67  brain]
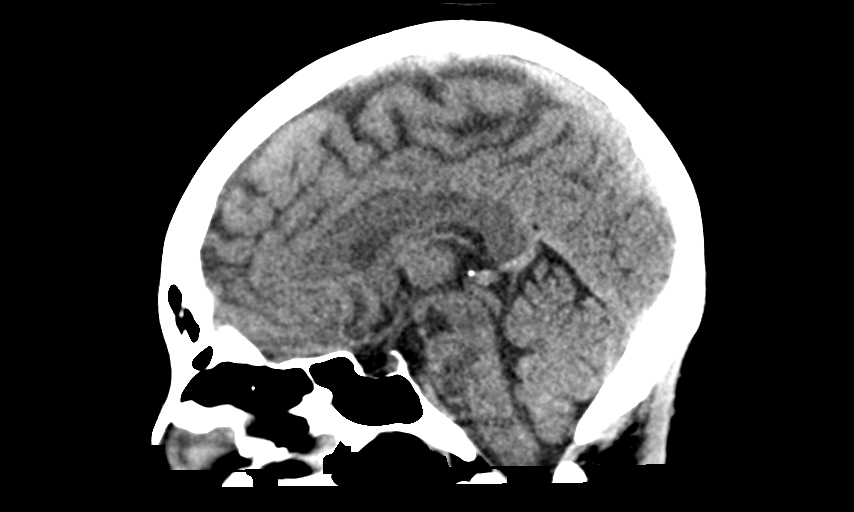
[im 45/67  brain]
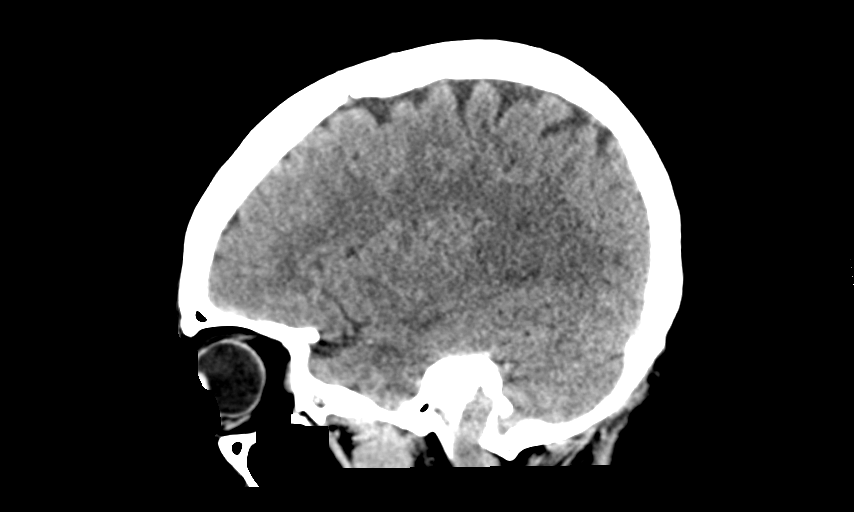

[15 of 47 positions shown; findings below may reference images not displayed]

FINDINGS: Brain: No acute intracranial abnormality. Specifically, no
hemorrhage, hydrocephalus, mass lesion, acute infarction, or
significant intracranial injury.

Vascular: No hyperdense vessel or unexpected calcification.

Skull: No acute calvarial abnormality.

Sinuses/Orbits: No acute findings

Other: None
IMPRESSION: No acute intracranial abnormality.

## 2022-05-08 DIAGNOSIS — M62830 Muscle spasm of back: Secondary | ICD-10-CM | POA: Diagnosis not present

## 2022-05-08 DIAGNOSIS — M9901 Segmental and somatic dysfunction of cervical region: Secondary | ICD-10-CM | POA: Diagnosis not present

## 2022-05-08 DIAGNOSIS — R519 Headache, unspecified: Secondary | ICD-10-CM | POA: Diagnosis not present

## 2022-05-08 DIAGNOSIS — M5386 Other specified dorsopathies, lumbar region: Secondary | ICD-10-CM | POA: Diagnosis not present

## 2022-05-08 DIAGNOSIS — M9902 Segmental and somatic dysfunction of thoracic region: Secondary | ICD-10-CM | POA: Diagnosis not present

## 2022-05-08 DIAGNOSIS — M9905 Segmental and somatic dysfunction of pelvic region: Secondary | ICD-10-CM | POA: Diagnosis not present

## 2022-05-08 DIAGNOSIS — M9903 Segmental and somatic dysfunction of lumbar region: Secondary | ICD-10-CM | POA: Diagnosis not present

## 2022-05-08 DIAGNOSIS — M531 Cervicobrachial syndrome: Secondary | ICD-10-CM | POA: Diagnosis not present

## 2022-08-13 DIAGNOSIS — M9905 Segmental and somatic dysfunction of pelvic region: Secondary | ICD-10-CM | POA: Diagnosis not present

## 2022-08-13 DIAGNOSIS — M9903 Segmental and somatic dysfunction of lumbar region: Secondary | ICD-10-CM | POA: Diagnosis not present

## 2022-08-13 DIAGNOSIS — M62830 Muscle spasm of back: Secondary | ICD-10-CM | POA: Diagnosis not present

## 2022-08-13 DIAGNOSIS — M531 Cervicobrachial syndrome: Secondary | ICD-10-CM | POA: Diagnosis not present

## 2022-08-13 DIAGNOSIS — M9908 Segmental and somatic dysfunction of rib cage: Secondary | ICD-10-CM | POA: Diagnosis not present

## 2022-08-13 DIAGNOSIS — M9901 Segmental and somatic dysfunction of cervical region: Secondary | ICD-10-CM | POA: Diagnosis not present

## 2022-08-13 DIAGNOSIS — M9902 Segmental and somatic dysfunction of thoracic region: Secondary | ICD-10-CM | POA: Diagnosis not present

## 2022-08-14 ENCOUNTER — Telehealth: Payer: Self-pay | Admitting: Nurse Practitioner

## 2022-08-14 ENCOUNTER — Ambulatory Visit: Payer: 59 | Admitting: Nurse Practitioner

## 2022-08-14 NOTE — Telephone Encounter (Signed)
Pt called at 9:40 and said that the patient  is in the hospital . So I did not send a no show letter or EOD

## 2022-08-15 DIAGNOSIS — M9905 Segmental and somatic dysfunction of pelvic region: Secondary | ICD-10-CM | POA: Diagnosis not present

## 2022-08-15 DIAGNOSIS — M9901 Segmental and somatic dysfunction of cervical region: Secondary | ICD-10-CM | POA: Diagnosis not present

## 2022-08-15 DIAGNOSIS — M531 Cervicobrachial syndrome: Secondary | ICD-10-CM | POA: Diagnosis not present

## 2022-08-15 DIAGNOSIS — M9908 Segmental and somatic dysfunction of rib cage: Secondary | ICD-10-CM | POA: Diagnosis not present

## 2022-08-15 DIAGNOSIS — M62830 Muscle spasm of back: Secondary | ICD-10-CM | POA: Diagnosis not present

## 2022-08-15 DIAGNOSIS — M9902 Segmental and somatic dysfunction of thoracic region: Secondary | ICD-10-CM | POA: Diagnosis not present

## 2022-08-15 DIAGNOSIS — M9903 Segmental and somatic dysfunction of lumbar region: Secondary | ICD-10-CM | POA: Diagnosis not present

## 2022-08-15 NOTE — Telephone Encounter (Signed)
Noted  

## 2022-08-15 NOTE — Telephone Encounter (Signed)
Appt cancelled - I do not see a local hospitalization

## 2022-08-19 DIAGNOSIS — M9905 Segmental and somatic dysfunction of pelvic region: Secondary | ICD-10-CM | POA: Diagnosis not present

## 2022-08-19 DIAGNOSIS — M531 Cervicobrachial syndrome: Secondary | ICD-10-CM | POA: Diagnosis not present

## 2022-08-19 DIAGNOSIS — M9903 Segmental and somatic dysfunction of lumbar region: Secondary | ICD-10-CM | POA: Diagnosis not present

## 2022-08-19 DIAGNOSIS — M9901 Segmental and somatic dysfunction of cervical region: Secondary | ICD-10-CM | POA: Diagnosis not present

## 2022-08-19 DIAGNOSIS — M62830 Muscle spasm of back: Secondary | ICD-10-CM | POA: Diagnosis not present

## 2022-08-19 DIAGNOSIS — M9908 Segmental and somatic dysfunction of rib cage: Secondary | ICD-10-CM | POA: Diagnosis not present

## 2022-08-19 DIAGNOSIS — M9902 Segmental and somatic dysfunction of thoracic region: Secondary | ICD-10-CM | POA: Diagnosis not present

## 2022-08-22 DIAGNOSIS — M9903 Segmental and somatic dysfunction of lumbar region: Secondary | ICD-10-CM | POA: Diagnosis not present

## 2022-08-22 DIAGNOSIS — M62830 Muscle spasm of back: Secondary | ICD-10-CM | POA: Diagnosis not present

## 2022-08-22 DIAGNOSIS — M9905 Segmental and somatic dysfunction of pelvic region: Secondary | ICD-10-CM | POA: Diagnosis not present

## 2022-08-22 DIAGNOSIS — M9908 Segmental and somatic dysfunction of rib cage: Secondary | ICD-10-CM | POA: Diagnosis not present

## 2022-08-22 DIAGNOSIS — M9901 Segmental and somatic dysfunction of cervical region: Secondary | ICD-10-CM | POA: Diagnosis not present

## 2022-08-22 DIAGNOSIS — M9902 Segmental and somatic dysfunction of thoracic region: Secondary | ICD-10-CM | POA: Diagnosis not present

## 2022-08-22 DIAGNOSIS — M531 Cervicobrachial syndrome: Secondary | ICD-10-CM | POA: Diagnosis not present

## 2022-08-26 DIAGNOSIS — M9902 Segmental and somatic dysfunction of thoracic region: Secondary | ICD-10-CM | POA: Diagnosis not present

## 2022-08-26 DIAGNOSIS — M9903 Segmental and somatic dysfunction of lumbar region: Secondary | ICD-10-CM | POA: Diagnosis not present

## 2022-08-26 DIAGNOSIS — M62830 Muscle spasm of back: Secondary | ICD-10-CM | POA: Diagnosis not present

## 2022-08-26 DIAGNOSIS — M9908 Segmental and somatic dysfunction of rib cage: Secondary | ICD-10-CM | POA: Diagnosis not present

## 2022-08-26 DIAGNOSIS — M9905 Segmental and somatic dysfunction of pelvic region: Secondary | ICD-10-CM | POA: Diagnosis not present

## 2022-08-26 DIAGNOSIS — M9901 Segmental and somatic dysfunction of cervical region: Secondary | ICD-10-CM | POA: Diagnosis not present

## 2022-08-26 DIAGNOSIS — M531 Cervicobrachial syndrome: Secondary | ICD-10-CM | POA: Diagnosis not present

## 2022-08-28 DIAGNOSIS — M9901 Segmental and somatic dysfunction of cervical region: Secondary | ICD-10-CM | POA: Diagnosis not present

## 2022-08-28 DIAGNOSIS — M9903 Segmental and somatic dysfunction of lumbar region: Secondary | ICD-10-CM | POA: Diagnosis not present

## 2022-08-28 DIAGNOSIS — M9902 Segmental and somatic dysfunction of thoracic region: Secondary | ICD-10-CM | POA: Diagnosis not present

## 2022-08-28 DIAGNOSIS — M531 Cervicobrachial syndrome: Secondary | ICD-10-CM | POA: Diagnosis not present

## 2022-08-28 DIAGNOSIS — M62830 Muscle spasm of back: Secondary | ICD-10-CM | POA: Diagnosis not present

## 2022-08-28 DIAGNOSIS — M9905 Segmental and somatic dysfunction of pelvic region: Secondary | ICD-10-CM | POA: Diagnosis not present

## 2022-08-28 DIAGNOSIS — M9908 Segmental and somatic dysfunction of rib cage: Secondary | ICD-10-CM | POA: Diagnosis not present

## 2022-08-29 ENCOUNTER — Other Ambulatory Visit: Payer: Self-pay

## 2022-08-29 ENCOUNTER — Ambulatory Visit (HOSPITAL_COMMUNITY)
Admission: EM | Admit: 2022-08-29 | Discharge: 2022-08-29 | Disposition: A | Discharge status: Home / Self Care | Payer: 59 | Attending: Sports Medicine | Admitting: Sports Medicine

## 2022-08-29 ENCOUNTER — Ambulatory Visit (HOSPITAL_COMMUNITY): Payer: 59

## 2022-08-29 ENCOUNTER — Ambulatory Visit (INDEPENDENT_AMBULATORY_CARE_PROVIDER_SITE_OTHER): Payer: 59

## 2022-08-29 ENCOUNTER — Encounter (HOSPITAL_COMMUNITY): Payer: Self-pay | Admitting: Emergency Medicine

## 2022-08-29 DIAGNOSIS — S99922A Unspecified injury of left foot, initial encounter: Secondary | ICD-10-CM | POA: Diagnosis not present

## 2022-08-29 DIAGNOSIS — S92355A Nondisplaced fracture of fifth metatarsal bone, left foot, initial encounter for closed fracture: Secondary | ICD-10-CM

## 2022-08-29 DIAGNOSIS — S161XXA Strain of muscle, fascia and tendon at neck level, initial encounter: Secondary | ICD-10-CM

## 2022-08-29 MED ORDER — MELOXICAM 15 MG PO TABS: 15.0000 mg | ORAL_TABLET | Freq: Every day | ORAL | 0 refills | Status: DC | PRN

## 2022-08-29 NOTE — Discharge Instructions (Signed)
You have a nondisplaced fracture of your fifth metatarsal.  You are to wear this boot whenever you are up and walking around.  I have provided the information of the orthopedic office.  You should call them today and make an appointment in 1 to 2 weeks for repeat x-ray.  I have also refilled the meloxicam you have had in the past to help with the pain and swelling. For your neck pain that work on the stretches and exercises on the handouts I gave you.

## 2022-08-29 NOTE — ED Triage Notes (Signed)
Pt c/o falling last nigh have left foot twisted she is on a lot of pain now, foot looks bruised. Pt is c/o neck and upper back pain for a month too.

## 2022-08-29 NOTE — ED Provider Notes (Signed)
[facility]    CSN: [id number] Arrival date & time: 08/29/22  4098      History   Chief Complaint Chief Complaint  Patient presents with   Foot Injury   Neck Pain    Pt c/o falling last nigh have left foot twisted she is on a lot of pain now, foot looks bruised. Pt is c/o neck and upper back pain for a month too.     HPI Jessica Perry is a 42 y.o. female.   She is here today with her own translator with a chief complaint of left foot pain after an inversion injury when she stepped down from a pedicure chair.  She has some pain over the top of her foot swelling and bruising.  She was able to walk after her injury and today however she is limping quite a bit.  She states she has a lot of pain she is lifting her foot up or pushing down to walk.  She also has some chronic neck pain that her primary care provider gave her meloxicam and Flexeril for.  She reports that Flexeril makes her sleepy.  States her pain is worse when she first wakes up in the morning.  She denies any radiation of the pain down to her arms or numbness and tingling to her hands.   Foot Injury Associated symptoms: neck pain   Neck Pain   Past Medical History:  Diagnosis Date   Asthma    Gestational diabetes     Patient Active Problem List   Diagnosis Date Noted   Anxiety and depression 01/30/2022   Neck pain 01/30/2022   Moderate persistent asthma without complication 01/30/2022   Shortness of breath 02/07/2021    Past Surgical History:  Procedure Laterality Date   TUBAL LIGATION  11/22/2011   Procedure: POST PARTUM TUBAL LIGATION;  Surgeon: Loney Laurence, MD;  Location: [facility];  Service: Gynecology;  Laterality: Bilateral;    OB History     Gravida  3   Para  2   Term  2   Preterm  0   AB  1   Living  2      SAB  1   IAB  0   Ectopic  0   Multiple  0   Live Births  2            Home Medications    Prior to Admission medications   Medication Sig Start Date  End Date Taking? Authorizing Provider  albuterol (VENTOLIN HFA) 108 (90 Base) MCG/ACT inhaler Inhale 2 puffs into the lungs every 6 (six) hours as needed for wheezing or shortness of breath (as needed, this is a rescue inhaler). 01/30/22   McElwee, Jake Church, NP  cyclobenzaprine (FLEXERIL) 10 MG tablet Take 1 tablet (10 mg total) by mouth 3 (three) times daily as needed for muscle spasms (muscle tightness). 01/30/22   McElwee, Lauren A, NP  meloxicam (MOBIC) 15 MG tablet TAKE 1 TABLET (15 MG TOTAL) BY MOUTH DAILY AS NEEDED FOR PAIN (NECK PAIN). 02/27/22   McElwee, Lauren A, NP  sertraline (ZOLOFT) 25 MG tablet Take 1 tablet (25 mg total) by mouth daily. For mood, sleep 01/30/22   Gerre Scull, NP    Family History Family History  Problem Relation Age of Onset   Healthy Mother    Healthy Father    Heart disease Maternal Grandmother    Hypertension Maternal Grandmother    Diabetes Maternal Grandmother  Social History Social History   Tobacco Use   Smoking status: Never   Smokeless tobacco: Never  Vaping Use   Vaping Use: Never used  Substance Use Topics   Alcohol use: Never   Drug use: Not Currently     Allergies   Patient has no known allergies.   Review of Systems Review of Systems  Musculoskeletal:  Positive for neck pain.     Physical Exam Triage Vital Signs ED Triage Vitals  Enc Vitals Group     BP 08/29/22 1055 106/75     Pulse Rate 08/29/22 1054 83     Resp 08/29/22 1054 18     Temp 08/29/22 1054 98 F (36.7 C)     Temp Source 08/29/22 1054 Oral     SpO2 08/29/22 1054 100 %     Weight 08/29/22 1055 138 lb 14.2 oz (63 kg)     Height 08/29/22 1055 5\' 4"  (1.626 m)     Head Circumference --      Peak Flow --      Pain Score 08/29/22 1054 7     Pain Loc --      Pain Edu? --      Excl. in GC? --    No data found.  Updated Vital Signs BP 106/75   Pulse 83   Temp 98 F (36.7 C) (Oral)   Resp 18   Ht 5\' 4"  (1.626 m)   Wt 63 kg   SpO2 100%    BMI 23.84 kg/m   Physical Exam Vitals reviewed.  Constitutional:      General: She is not in acute distress.    Appearance: Normal appearance. She is normal weight. She is not ill-appearing, toxic-appearing or diaphoretic.  Cardiovascular:     Rate and Rhythm: Normal rate.  Pulmonary:     Effort: Pulmonary effort is normal.  Musculoskeletal:        General: Swelling and signs of injury present. Normal range of motion.  Skin:    General: Skin is warm.     Findings: Bruising present.  Neurological:     Mental Status: She is alert.  Psychiatric:        Mood and Affect: Mood normal.        Behavior: Behavior normal.        Thought Content: Thought content normal.        Judgment: Judgment normal.   Left foot: No obvious deformity or asymmetry.  She does have some swelling and ecchymosis over the dorsal surface of her foot.  Tenderness to palpation at the base of the second third and fourth metatarsal.  Some slight tenderness to palpation at the base of the fifth MTP.  No tenderness to palpation of the medial or lateral malleolus.  Full range of motion with plantarflexion and dorsiflexion.  Pain with resisted plantarflexion and eversion.  Dorsalis pedis 2+.  Sensation intact to light touch.  Negative squeeze test.  No tenderness to palpation of the navicular. Antalgic gait. Neck: Full range of motion with flexion and extension.  Some tenderness to palpation of the trapezius muscles.  UC Treatments / Results  Labs (all labs ordered are listed, but only abnormal results are displayed) Labs Reviewed - No data to display  EKG   Radiology No results found.  Procedures Procedures (including critical care time)  Medications Ordered in UC Medications - No data to display  Initial Impression / Assessment and Plan / UC Course  I have reviewed the  triage vital signs and the nursing notes.  Pertinent labs & imaging results that were available during my care of the patient were reviewed  by me and considered in my medical decision making (see chart for details).     Left foot pain X-rays left foot showed a nondisplaced fracture of the base of the fifth metatarsal.  She is placed in a cam walker boot and counseled to ambulate in this boot for the next few weeks.  She was also provided with the information for on-call provider.  I encouraged her to call [facility] to make an appointment for 1 to 2 weeks for follow-up x-rays.  I also sent to her pharmacy for meloxicam to with her pain and swelling. Final Clinical Impressions(s) / UC Diagnoses   Final diagnoses:  None   Discharge Instructions   None    ED Prescriptions   None    PDMP not reviewed this encounter.   Claudie Leach, DO 08/29/22 1153

## 2022-08-30 ENCOUNTER — Ambulatory Visit: Payer: 59 | Admitting: Internal Medicine

## 2022-09-03 DIAGNOSIS — M9905 Segmental and somatic dysfunction of pelvic region: Secondary | ICD-10-CM | POA: Diagnosis not present

## 2022-09-03 DIAGNOSIS — M9902 Segmental and somatic dysfunction of thoracic region: Secondary | ICD-10-CM | POA: Diagnosis not present

## 2022-09-03 DIAGNOSIS — M9903 Segmental and somatic dysfunction of lumbar region: Secondary | ICD-10-CM | POA: Diagnosis not present

## 2022-09-03 DIAGNOSIS — M542 Cervicalgia: Secondary | ICD-10-CM | POA: Diagnosis not present

## 2022-09-03 DIAGNOSIS — S92355A Nondisplaced fracture of fifth metatarsal bone, left foot, initial encounter for closed fracture: Secondary | ICD-10-CM | POA: Diagnosis not present

## 2022-09-03 DIAGNOSIS — M9901 Segmental and somatic dysfunction of cervical region: Secondary | ICD-10-CM | POA: Diagnosis not present

## 2022-09-03 DIAGNOSIS — M531 Cervicobrachial syndrome: Secondary | ICD-10-CM | POA: Diagnosis not present

## 2022-09-03 DIAGNOSIS — M9908 Segmental and somatic dysfunction of rib cage: Secondary | ICD-10-CM | POA: Diagnosis not present

## 2022-09-03 DIAGNOSIS — M62830 Muscle spasm of back: Secondary | ICD-10-CM | POA: Diagnosis not present

## 2022-09-10 DIAGNOSIS — M9905 Segmental and somatic dysfunction of pelvic region: Secondary | ICD-10-CM | POA: Diagnosis not present

## 2022-09-10 DIAGNOSIS — M9902 Segmental and somatic dysfunction of thoracic region: Secondary | ICD-10-CM | POA: Diagnosis not present

## 2022-09-10 DIAGNOSIS — M531 Cervicobrachial syndrome: Secondary | ICD-10-CM | POA: Diagnosis not present

## 2022-09-10 DIAGNOSIS — M9908 Segmental and somatic dysfunction of rib cage: Secondary | ICD-10-CM | POA: Diagnosis not present

## 2022-09-10 DIAGNOSIS — M62830 Muscle spasm of back: Secondary | ICD-10-CM | POA: Diagnosis not present

## 2022-09-10 DIAGNOSIS — M9901 Segmental and somatic dysfunction of cervical region: Secondary | ICD-10-CM | POA: Diagnosis not present

## 2022-09-10 DIAGNOSIS — M9903 Segmental and somatic dysfunction of lumbar region: Secondary | ICD-10-CM | POA: Diagnosis not present

## 2022-09-14 ENCOUNTER — Other Ambulatory Visit: Payer: Self-pay

## 2022-09-14 ENCOUNTER — Emergency Department
Admission: EM | Admit: 2022-09-14 | Discharge: 2022-09-14 | Disposition: A | Discharge status: Home / Self Care | Payer: 59 | Attending: Emergency Medicine | Admitting: Emergency Medicine

## 2022-09-14 ENCOUNTER — Emergency Department: Payer: 59

## 2022-09-14 DIAGNOSIS — M5489 Other dorsalgia: Secondary | ICD-10-CM | POA: Diagnosis not present

## 2022-09-14 DIAGNOSIS — Y92410 Unspecified street and highway as the place of occurrence of the external cause: Secondary | ICD-10-CM | POA: Insufficient documentation

## 2022-09-14 DIAGNOSIS — R519 Headache, unspecified: Secondary | ICD-10-CM | POA: Insufficient documentation

## 2022-09-14 DIAGNOSIS — S199XXA Unspecified injury of neck, initial encounter: Secondary | ICD-10-CM | POA: Diagnosis not present

## 2022-09-14 DIAGNOSIS — M549 Dorsalgia, unspecified: Secondary | ICD-10-CM

## 2022-09-14 DIAGNOSIS — S4992XA Unspecified injury of left shoulder and upper arm, initial encounter: Secondary | ICD-10-CM | POA: Diagnosis not present

## 2022-09-14 DIAGNOSIS — S161XXA Strain of muscle, fascia and tendon at neck level, initial encounter: Secondary | ICD-10-CM | POA: Diagnosis not present

## 2022-09-14 DIAGNOSIS — S39012A Strain of muscle, fascia and tendon of lower back, initial encounter: Secondary | ICD-10-CM | POA: Diagnosis not present

## 2022-09-14 DIAGNOSIS — M542 Cervicalgia: Secondary | ICD-10-CM | POA: Diagnosis not present

## 2022-09-14 DIAGNOSIS — M47812 Spondylosis without myelopathy or radiculopathy, cervical region: Secondary | ICD-10-CM | POA: Diagnosis not present

## 2022-09-14 DIAGNOSIS — Z743 Need for continuous supervision: Secondary | ICD-10-CM | POA: Diagnosis not present
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
## 2022-09-14 NOTE — ED Notes (Signed)
Ice pack given for left scapular pain
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
## 2022-09-16 ENCOUNTER — Encounter (HOSPITAL_COMMUNITY): Payer: Self-pay | Admitting: Emergency Medicine

## 2022-09-16 ENCOUNTER — Other Ambulatory Visit: Payer: Self-pay

## 2022-09-16 ENCOUNTER — Ambulatory Visit (HOSPITAL_COMMUNITY)
Admission: EM | Admit: 2022-09-16 | Discharge: 2022-09-16 | Disposition: A | Discharge status: Home / Self Care | Payer: 59 | Attending: Family Medicine | Admitting: Family Medicine

## 2022-09-16 ENCOUNTER — Emergency Department (HOSPITAL_COMMUNITY): Payer: 59

## 2022-09-16 ENCOUNTER — Encounter (HOSPITAL_COMMUNITY): Payer: Self-pay

## 2022-09-16 ENCOUNTER — Emergency Department (HOSPITAL_COMMUNITY)
Admission: EM | Admit: 2022-09-16 | Discharge: 2022-09-16 | Discharge status: Against Medical Advice | Payer: 59 | Attending: Emergency Medicine | Admitting: Emergency Medicine

## 2022-09-16 DIAGNOSIS — R519 Headache, unspecified: Secondary | ICD-10-CM

## 2022-09-16 DIAGNOSIS — J45909 Unspecified asthma, uncomplicated: Secondary | ICD-10-CM | POA: Diagnosis not present

## 2022-09-16 DIAGNOSIS — S199XXA Unspecified injury of neck, initial encounter: Secondary | ICD-10-CM | POA: Diagnosis not present

## 2022-09-16 DIAGNOSIS — S060X0A Concussion without loss of consciousness, initial encounter: Secondary | ICD-10-CM | POA: Insufficient documentation

## 2022-09-16 DIAGNOSIS — S0990XA Unspecified injury of head, initial encounter: Secondary | ICD-10-CM | POA: Diagnosis not present

## 2022-09-16 DIAGNOSIS — Y92410 Unspecified street and highway as the place of occurrence of the external cause: Secondary | ICD-10-CM | POA: Insufficient documentation

## 2022-09-16 LAB — PREGNANCY, URINE: Preg Test, Ur: NEGATIVE
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
## 2022-09-16 NOTE — Discharge Instructions (Signed)
Patient is going to proceed to the emergency room for further evaluation
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
## 2022-10-08 DIAGNOSIS — M9908 Segmental and somatic dysfunction of rib cage: Secondary | ICD-10-CM | POA: Diagnosis not present

## 2022-10-08 DIAGNOSIS — M9901 Segmental and somatic dysfunction of cervical region: Secondary | ICD-10-CM | POA: Diagnosis not present

## 2022-10-08 DIAGNOSIS — M62830 Muscle spasm of back: Secondary | ICD-10-CM | POA: Diagnosis not present

## 2022-10-08 DIAGNOSIS — M9903 Segmental and somatic dysfunction of lumbar region: Secondary | ICD-10-CM | POA: Diagnosis not present

## 2022-10-08 DIAGNOSIS — M531 Cervicobrachial syndrome: Secondary | ICD-10-CM | POA: Diagnosis not present

## 2022-10-08 DIAGNOSIS — M9902 Segmental and somatic dysfunction of thoracic region: Secondary | ICD-10-CM | POA: Diagnosis not present

## 2022-10-08 DIAGNOSIS — M9905 Segmental and somatic dysfunction of pelvic region: Secondary | ICD-10-CM | POA: Diagnosis not present

## 2022-10-09 ENCOUNTER — Encounter (INDEPENDENT_AMBULATORY_CARE_PROVIDER_SITE_OTHER): Payer: Self-pay

## 2022-10-10 DIAGNOSIS — M531 Cervicobrachial syndrome: Secondary | ICD-10-CM | POA: Diagnosis not present

## 2022-10-10 DIAGNOSIS — M9903 Segmental and somatic dysfunction of lumbar region: Secondary | ICD-10-CM | POA: Diagnosis not present

## 2022-10-10 DIAGNOSIS — M9902 Segmental and somatic dysfunction of thoracic region: Secondary | ICD-10-CM | POA: Diagnosis not present

## 2022-10-10 DIAGNOSIS — M62830 Muscle spasm of back: Secondary | ICD-10-CM | POA: Diagnosis not present

## 2022-10-10 DIAGNOSIS — M9905 Segmental and somatic dysfunction of pelvic region: Secondary | ICD-10-CM | POA: Diagnosis not present

## 2022-10-10 DIAGNOSIS — M9908 Segmental and somatic dysfunction of rib cage: Secondary | ICD-10-CM | POA: Diagnosis not present

## 2022-10-10 DIAGNOSIS — M9901 Segmental and somatic dysfunction of cervical region: Secondary | ICD-10-CM | POA: Diagnosis not present

## 2022-10-15 DIAGNOSIS — M9901 Segmental and somatic dysfunction of cervical region: Secondary | ICD-10-CM | POA: Diagnosis not present

## 2022-10-15 DIAGNOSIS — M62830 Muscle spasm of back: Secondary | ICD-10-CM | POA: Diagnosis not present

## 2022-10-15 DIAGNOSIS — M9903 Segmental and somatic dysfunction of lumbar region: Secondary | ICD-10-CM | POA: Diagnosis not present

## 2022-10-15 DIAGNOSIS — M9905 Segmental and somatic dysfunction of pelvic region: Secondary | ICD-10-CM | POA: Diagnosis not present

## 2022-10-15 DIAGNOSIS — M9902 Segmental and somatic dysfunction of thoracic region: Secondary | ICD-10-CM | POA: Diagnosis not present

## 2022-10-15 DIAGNOSIS — M9908 Segmental and somatic dysfunction of rib cage: Secondary | ICD-10-CM | POA: Diagnosis not present

## 2022-10-15 DIAGNOSIS — M531 Cervicobrachial syndrome: Secondary | ICD-10-CM | POA: Diagnosis not present

## 2022-10-21 DIAGNOSIS — M9903 Segmental and somatic dysfunction of lumbar region: Secondary | ICD-10-CM | POA: Diagnosis not present

## 2022-10-21 DIAGNOSIS — M62830 Muscle spasm of back: Secondary | ICD-10-CM | POA: Diagnosis not present

## 2022-10-21 DIAGNOSIS — M9902 Segmental and somatic dysfunction of thoracic region: Secondary | ICD-10-CM | POA: Diagnosis not present

## 2022-10-21 DIAGNOSIS — M531 Cervicobrachial syndrome: Secondary | ICD-10-CM | POA: Diagnosis not present

## 2022-10-21 DIAGNOSIS — M9908 Segmental and somatic dysfunction of rib cage: Secondary | ICD-10-CM | POA: Diagnosis not present

## 2022-10-21 DIAGNOSIS — M9901 Segmental and somatic dysfunction of cervical region: Secondary | ICD-10-CM | POA: Diagnosis not present

## 2022-10-21 DIAGNOSIS — M9905 Segmental and somatic dysfunction of pelvic region: Secondary | ICD-10-CM | POA: Diagnosis not present

## 2022-10-24 DIAGNOSIS — M62830 Muscle spasm of back: Secondary | ICD-10-CM | POA: Diagnosis not present

## 2022-10-24 DIAGNOSIS — M531 Cervicobrachial syndrome: Secondary | ICD-10-CM | POA: Diagnosis not present

## 2022-10-24 DIAGNOSIS — M9908 Segmental and somatic dysfunction of rib cage: Secondary | ICD-10-CM | POA: Diagnosis not present

## 2022-10-24 DIAGNOSIS — M9901 Segmental and somatic dysfunction of cervical region: Secondary | ICD-10-CM | POA: Diagnosis not present

## 2022-10-24 DIAGNOSIS — M9905 Segmental and somatic dysfunction of pelvic region: Secondary | ICD-10-CM | POA: Diagnosis not present

## 2022-10-24 DIAGNOSIS — M9902 Segmental and somatic dysfunction of thoracic region: Secondary | ICD-10-CM | POA: Diagnosis not present

## 2022-10-24 DIAGNOSIS — M9903 Segmental and somatic dysfunction of lumbar region: Secondary | ICD-10-CM | POA: Diagnosis not present

## 2022-10-28 DIAGNOSIS — M9908 Segmental and somatic dysfunction of rib cage: Secondary | ICD-10-CM | POA: Diagnosis not present

## 2022-10-28 DIAGNOSIS — M9903 Segmental and somatic dysfunction of lumbar region: Secondary | ICD-10-CM | POA: Diagnosis not present

## 2022-10-28 DIAGNOSIS — M9905 Segmental and somatic dysfunction of pelvic region: Secondary | ICD-10-CM | POA: Diagnosis not present

## 2022-10-28 DIAGNOSIS — M531 Cervicobrachial syndrome: Secondary | ICD-10-CM | POA: Diagnosis not present

## 2022-10-28 DIAGNOSIS — M9902 Segmental and somatic dysfunction of thoracic region: Secondary | ICD-10-CM | POA: Diagnosis not present

## 2022-10-28 DIAGNOSIS — M62830 Muscle spasm of back: Secondary | ICD-10-CM | POA: Diagnosis not present

## 2022-10-28 DIAGNOSIS — M9901 Segmental and somatic dysfunction of cervical region: Secondary | ICD-10-CM | POA: Diagnosis not present

## 2022-10-29 ENCOUNTER — Ambulatory Visit (INDEPENDENT_AMBULATORY_CARE_PROVIDER_SITE_OTHER): Payer: 59 | Admitting: Nurse Practitioner

## 2022-10-29 ENCOUNTER — Other Ambulatory Visit (HOSPITAL_COMMUNITY)
Admission: RE | Admit: 2022-10-29 | Discharge: 2022-10-29 | Disposition: A | Discharge status: Home / Self Care | Payer: 59 | Source: Ambulatory Visit | Attending: Nurse Practitioner | Admitting: Nurse Practitioner

## 2022-10-29 ENCOUNTER — Encounter: Payer: Self-pay | Admitting: Nurse Practitioner

## 2022-10-29 VITALS — BP 106/64 | HR 65 | Temp 97.8°F | Ht 64.0 in | Wt 141.0 lb

## 2022-10-29 DIAGNOSIS — Z136 Encounter for screening for cardiovascular disorders: Secondary | ICD-10-CM

## 2022-10-29 DIAGNOSIS — Z1159 Encounter for screening for other viral diseases: Secondary | ICD-10-CM | POA: Diagnosis not present

## 2022-10-29 DIAGNOSIS — Z124 Encounter for screening for malignant neoplasm of cervix: Secondary | ICD-10-CM | POA: Insufficient documentation

## 2022-10-29 DIAGNOSIS — M79671 Pain in right foot: Secondary | ICD-10-CM | POA: Diagnosis not present

## 2022-10-29 DIAGNOSIS — M542 Cervicalgia: Secondary | ICD-10-CM | POA: Diagnosis not present

## 2022-10-29 DIAGNOSIS — M25572 Pain in left ankle and joints of left foot: Secondary | ICD-10-CM | POA: Diagnosis not present

## 2022-10-29 DIAGNOSIS — M79672 Pain in left foot: Secondary | ICD-10-CM | POA: Diagnosis not present

## 2022-10-29 DIAGNOSIS — Z0000 Encounter for general adult medical examination without abnormal findings: Secondary | ICD-10-CM | POA: Diagnosis not present

## 2022-10-29 DIAGNOSIS — Z1231 Encounter for screening mammogram for malignant neoplasm of breast: Secondary | ICD-10-CM | POA: Diagnosis not present

## 2022-10-29 DIAGNOSIS — M25571 Pain in right ankle and joints of right foot: Secondary | ICD-10-CM | POA: Diagnosis not present

## 2022-10-29 NOTE — Patient Instructions (Signed)
It was great to see you!  You are scheduled for labs on 11/25/22 at 10:20am. You are scheduled for a mammogram at our office on 11/25/22 at 10:50 am  I have placed a referral to PT and they will call to schedule   Keep taking the meloxicam once a day as needed for pain with food  You can also try a lidocaine patch that you put on in the morning and take off at night. You can get this at any pharmacy.   Let's follow-up in 2 months, sooner if you have concerns.  If a referral was placed today, you will be contacted for an appointment. Please note that routine referrals can sometimes take up to 3-4 weeks to process. Please call our office if you haven't heard anything after this time frame.  Take care,  Rodman Pickle, NP

## 2022-10-29 NOTE — Assessment & Plan Note (Signed)
She fractured her left foot 2 months ago at the base of the fifth metacarpal.  She also rolled her foot a month ago and has been having right foot pain as well.  She has an appointment with a specialist today, encouraged her to keep that appointment.  She can continue taking meloxicam as needed for pain.

## 2022-10-29 NOTE — Assessment & Plan Note (Signed)
Health maintenance reviewed and updated. Discussed nutrition, exercise. Check CMP, CBC today. Follow-up 1 year.   

## 2022-10-29 NOTE — Assessment & Plan Note (Signed)
Chronic, ongoing.  This has been going on for several months.  She had an x-ray of her cervical spine which showed osteoarthritis and some straightening.  She went to a chiropractor which did not help.  Will refer her to a physical therapist.  She can continue meloxicam 15 mg daily as needed.  She can also continue to use ice or heat and start a lidocaine patch daily.  If symptoms are not improving, will consider MRI and referral to specialist.

## 2022-10-29 NOTE — Progress Notes (Signed)
BP 106/64 (BP Location: Left Arm)   Pulse 65   Temp 97.8 F (36.6 C)   Ht 5\' 4"  (1.626 m)   Wt 141 lb (64 kg)   SpO2 98%   BMI 24.20 kg/m    Subjective:    Patient ID: Jessica Perry, female    DOB: [birth year], 42 y.o.   MRN: [id number]  CC: Chief Complaint  Patient presents with   Annual Exam    With lab work-patient is not fasting, Concerns right foot pain from fall 1 month ago, neck pain    HPI: Jessica Perry is a 42 y.o. female presenting on 10/29/2022 for comprehensive medical examination. Current medical complaints include: neck pain, foot pain  She states that she twisted her ankle and broke her left foot 2 months ago.  She also notes that she rolled her ankle and is having right foot pain now for the last month.  She denies swelling and bruising.  She has not been taking anything for the pain.  She notes that she has been limping due to the pain.  She has an appointment with a specialist today.  She also was having ongoing chronic neck pain.  This has been going on for 2 months.  She had x-rays done which showed osteoarthritis and degenerative changes.  She used ice last night which did help with the symptoms.  She states that the pain is worse when she wakes up in the morning.  The pain does not radiate.  She currently lives with: husband, kids Menopausal Symptoms: no  Depression and Anxiety Screen done today and results listed below:     10/29/2022   10:25 AM 01/30/2022    2:45 PM 01/30/2022    2:00 PM 01/30/2018   11:51 AM  Depression screen PHQ 2/9  Decreased Interest 1 1 3  0  Down, Depressed, Hopeless 0 3 3 0  PHQ - 2 Score 1 4 6  0  Altered sleeping 3 3    Tired, decreased energy 2 3    Change in appetite 0 3    Feeling bad or failure about yourself  2 3    Trouble concentrating 0 2    Moving slowly or fidgety/restless 0 3    Suicidal thoughts 0 0    PHQ-9 Score 8 21    Difficult doing work/chores  Somewhat difficult        10/29/2022   10:26 AM 01/30/2022     2:46 PM  GAD 7 : Generalized Anxiety Score  Nervous, Anxious, on Edge 0 3  Control/stop worrying 0 1  Worry too much - different things 0 2  Trouble relaxing 0 2  Restless 0 2  Easily annoyed or irritable 0 2  Afraid - awful might happen 0 2  Total GAD 7 Score 0 14  Anxiety Difficulty  Somewhat difficult    The patient does not have a history of falls. I did not complete a risk assessment for falls. A plan of care for falls was not documented.   Past Medical History:  Past Medical History:  Diagnosis Date   Asthma    Gestational diabetes     Surgical History:  Past Surgical History:  Procedure Laterality Date   TUBAL LIGATION  11/22/2011   Procedure: POST PARTUM TUBAL LIGATION;  Surgeon: Loney Laurence, MD;  Location: [facility];  Service: Gynecology;  Laterality: Bilateral;    Medications:  Current Outpatient Medications on File Prior to Visit  Medication Sig  cyclobenzaprine (FLEXERIL) 10 MG tablet Take 1 tablet (10 mg total) by mouth 3 (three) times daily as needed for muscle spasms (muscle tightness). (Patient not taking: Reported on 10/29/2022)   meloxicam (MOBIC) 15 MG tablet Take 1 tablet (15 mg total) by mouth daily as needed for pain (neck pain). (Patient not taking: Reported on 10/29/2022)   No current facility-administered medications on file prior to visit.    Allergies:  No Known Allergies  Social History:  Social History   Socioeconomic History   Marital status: Married    Spouse name: Not on file   Number of children: Not on file   Years of education: Not on file   Highest education level: Not on file  Occupational History   Not on file  Tobacco Use   Smoking status: Never   Smokeless tobacco: Never  Vaping Use   Vaping status: Never Used  Substance and Sexual Activity   Alcohol use: Never   Drug use: Not Currently   Sexual activity: Yes    Birth control/protection: Surgical  Other Topics Concern   Not on file  Social History Narrative    Not on file   Social Determinants of Health   Financial Resource Strain: Not on file  Food Insecurity: No Food Insecurity (03/29/2020)   Received from [facility], [facility]   Hunger Vital Sign    Worried About Running Out of Food in the Last Year: Never true    Ran Out of Food in the Last Year: Never true  Transportation Needs: Not on file  Physical Activity: Not on file  Stress: Not on file  Social Connections: Unknown (07/23/2021)   Received from [facility], [facility]   Social Network    Social Network: Not on file  Intimate Partner Violence: Unknown (06/14/2021)   Received from [employer], [facility]   HITS    Physically Hurt: Not on file    Insult or Talk Down To: Not on file    Threaten Physical Harm: Not on file    Scream or Curse: Not on file   Social History   Tobacco Use  Smoking Status Never  Smokeless Tobacco Never   Social History   Substance and Sexual Activity  Alcohol Use Never    Family History:  Family History  Problem Relation Age of Onset   Healthy Mother    Healthy Father    Heart disease Maternal Grandmother    Hypertension Maternal Grandmother    Diabetes Maternal Grandmother     Past medical history, surgical history, medications, allergies, family history and social history reviewed with patient today and changes made to appropriate areas of the chart.   Review of Systems  Constitutional: Negative.   HENT: Negative.    Eyes: Negative.   Respiratory: Negative.    Cardiovascular: Negative.   Gastrointestinal: Negative.   Genitourinary: Negative.   Musculoskeletal:  Positive for joint pain (bilateral feet) and neck pain.  Skin: Negative.   Neurological: Negative.   Psychiatric/Behavioral: Negative.     All other ROS negative except what is listed above and in the HPI.      Objective:    BP 106/64 (BP Location: Left Arm)   Pulse 65   Temp 97.8 F (36.6 C)   Ht 5\' 4"  (1.626 m)   Wt 141 lb (64 kg)   SpO2 98%    BMI 24.20 kg/m   Wt Readings from Last 3 Encounters:  10/29/22 141 lb (64 kg)  09/16/22  127 lb 13.9 oz (58 kg)  09/16/22 130 lb (59 kg)    Physical Exam Vitals and nursing note reviewed. Exam conducted with a chaperone present.  Constitutional:      General: She is not in acute distress.    Appearance: Normal appearance.  HENT:     Head: Normocephalic and atraumatic.     Right Ear: Tympanic membrane, ear canal and external ear normal.     Left Ear: Tympanic membrane, ear canal and external ear normal.     Mouth/Throat:     Mouth: Mucous membranes are moist.     Pharynx: No posterior oropharyngeal erythema.  Eyes:     Conjunctiva/sclera: Conjunctivae normal.  Cardiovascular:     Rate and Rhythm: Normal rate and regular rhythm.     Pulses: Normal pulses.     Heart sounds: Normal heart sounds.  Pulmonary:     Effort: Pulmonary effort is normal.     Breath sounds: Normal breath sounds.  Abdominal:     Palpations: Abdomen is soft.     Tenderness: There is no abdominal tenderness.  Genitourinary:    General: Normal vulva.     Exam position: Lithotomy position.     Labia:        Right: No rash, tenderness or lesion.        Left: No rash, tenderness or lesion.      Vagina: Normal.     Cervix: Normal.     Uterus: Normal.      Adnexa: Right adnexa normal and left adnexa normal.  Musculoskeletal:        General: Tenderness (posterior neck) present. No swelling. Normal range of motion.     Cervical back: Normal range of motion and neck supple.     Right lower leg: No edema.     Left lower leg: No edema.  Lymphadenopathy:     Cervical: No cervical adenopathy.  Skin:    General: Skin is warm and dry.  Neurological:     General: No focal deficit present.     Mental Status: She is alert and oriented to person, place, and time.     Cranial Nerves: No cranial nerve deficit.     Coordination: Coordination normal.     Gait: Gait normal.  Psychiatric:        Mood and Affect: Mood  normal.        Behavior: Behavior normal.        Thought Content: Thought content normal.        Judgment: Judgment normal.     Results for orders placed or performed during the hospital encounter of 09/16/22  Pregnancy, urine  Result Value Ref Range   Preg Test, Ur NEGATIVE NEGATIVE      Assessment & Plan:   Problem List Items Addressed This Visit       Other   Neck pain    Chronic, ongoing.  This has been going on for several months.  She had an x-ray of her cervical spine which showed osteoarthritis and some straightening.  She went to a chiropractor which did not help.  Will refer her to a physical therapist.  She can continue meloxicam 15 mg daily as needed.  She can also continue to use ice or heat and start a lidocaine patch daily.  If symptoms are not improving, will consider MRI and referral to specialist.      Relevant Orders   Ambulatory referral to Physical Therapy   Pain in both  feet    She fractured her left foot 2 months ago at the base of the fifth metacarpal.  She also rolled her foot a month ago and has been having right foot pain as well.  She has an appointment with a specialist today, encouraged her to keep that appointment.  She can continue taking meloxicam as needed for pain.      Routine general medical examination at a health care facility - Primary    Health maintenance reviewed and updated. Discussed nutrition, exercise. Check CMP, CBC today. Follow-up 1 year.        Relevant Orders   CBC with Differential/Platelet   Comprehensive metabolic panel   Other Visit Diagnoses     Encounter for screening mammogram for malignant neoplasm of breast       mammogram ordered today, scheduled 11/25/22 at 10:50am at mobile mammogram bus   Relevant Orders   MM 3D SCREENING MAMMOGRAM BILATERAL BREAST   Screening for cervical cancer       Pap with HPV done today   Relevant Orders   Cytology - PAP   Screening for cardiovascular condition       Screen lipid  panel today   Relevant Orders   Lipid panel   Encounter for hepatitis C screening test for low risk patient       Screen hepatitis C today   Relevant Orders   Hepatitis C antibody        Follow up plan: Return in about 2 months (around 12/29/2022) for neck pain.   LABORATORY TESTING:  - Pap smear: pap done  IMMUNIZATIONS:   - Tdap: Tetanus vaccination status reviewed: last tetanus booster within 10 years. - Influenza: Postponed to flu season - Pneumovax: Not applicable - Prevnar: Not applicable - HPV: Not applicable - Shingrix vaccine: Not applicable  SCREENING: -Mammogram: Ordered today  - Colonoscopy: Not applicable  - Bone Density: Not applicable   PATIENT COUNSELING:   Advised to take 1 mg of folate supplement per day if capable of pregnancy.   Sexuality: Discussed sexually transmitted diseases, partner selection, use of condoms, avoidance of unintended pregnancy  and contraceptive alternatives.   Advised to avoid cigarette smoking.  I discussed with the patient that most people either abstain from alcohol or drink within safe limits (<=14/week and <=4 drinks/occasion for males, <=7/weeks and <= 3 drinks/occasion for females) and that the risk for alcohol disorders and other health effects rises proportionally with the number of drinks per week and how often a drinker exceeds daily limits.  Discussed cessation/primary prevention of drug use and availability of treatment for abuse.   Diet: Encouraged to adjust caloric intake to maintain  or achieve ideal body weight, to reduce intake of dietary saturated fat and total fat, to limit sodium intake by avoiding high sodium foods and not adding table salt, and to maintain adequate dietary potassium and calcium preferably from fresh fruits, vegetables, and low-fat dairy products.    stressed the importance of regular exercise  Injury prevention: Discussed safety belts, safety helmets, smoke detector, smoking near bedding or  upholstery.   Dental health: Discussed importance of regular tooth brushing, flossing, and dental visits.    NEXT PREVENTATIVE PHYSICAL DUE IN 1 YEAR. Return in about 2 months (around 12/29/2022) for neck pain.  Jezabelle Chisolm A Yama Nielson

## 2022-10-31 DIAGNOSIS — M9908 Segmental and somatic dysfunction of rib cage: Secondary | ICD-10-CM | POA: Diagnosis not present

## 2022-10-31 DIAGNOSIS — M9903 Segmental and somatic dysfunction of lumbar region: Secondary | ICD-10-CM | POA: Diagnosis not present

## 2022-10-31 DIAGNOSIS — M9902 Segmental and somatic dysfunction of thoracic region: Secondary | ICD-10-CM | POA: Diagnosis not present

## 2022-10-31 DIAGNOSIS — M531 Cervicobrachial syndrome: Secondary | ICD-10-CM | POA: Diagnosis not present

## 2022-10-31 DIAGNOSIS — M62830 Muscle spasm of back: Secondary | ICD-10-CM | POA: Diagnosis not present

## 2022-10-31 DIAGNOSIS — M9901 Segmental and somatic dysfunction of cervical region: Secondary | ICD-10-CM | POA: Diagnosis not present

## 2022-10-31 DIAGNOSIS — M9905 Segmental and somatic dysfunction of pelvic region: Secondary | ICD-10-CM | POA: Diagnosis not present

## 2022-10-31 LAB — CYTOLOGY - PAP
Comment: NEGATIVE
Diagnosis: NEGATIVE
High risk HPV: NEGATIVE

## 2022-11-04 DIAGNOSIS — M9908 Segmental and somatic dysfunction of rib cage: Secondary | ICD-10-CM | POA: Diagnosis not present

## 2022-11-04 DIAGNOSIS — M9902 Segmental and somatic dysfunction of thoracic region: Secondary | ICD-10-CM | POA: Diagnosis not present

## 2022-11-04 DIAGNOSIS — M9901 Segmental and somatic dysfunction of cervical region: Secondary | ICD-10-CM | POA: Diagnosis not present

## 2022-11-04 DIAGNOSIS — M9903 Segmental and somatic dysfunction of lumbar region: Secondary | ICD-10-CM | POA: Diagnosis not present

## 2022-11-04 DIAGNOSIS — M62830 Muscle spasm of back: Secondary | ICD-10-CM | POA: Diagnosis not present

## 2022-11-04 DIAGNOSIS — M531 Cervicobrachial syndrome: Secondary | ICD-10-CM | POA: Diagnosis not present

## 2022-11-04 DIAGNOSIS — M9905 Segmental and somatic dysfunction of pelvic region: Secondary | ICD-10-CM | POA: Diagnosis not present

## 2022-11-18 DIAGNOSIS — M9902 Segmental and somatic dysfunction of thoracic region: Secondary | ICD-10-CM | POA: Diagnosis not present

## 2022-11-18 DIAGNOSIS — M9901 Segmental and somatic dysfunction of cervical region: Secondary | ICD-10-CM | POA: Diagnosis not present

## 2022-11-18 DIAGNOSIS — M62830 Muscle spasm of back: Secondary | ICD-10-CM | POA: Diagnosis not present

## 2022-11-18 DIAGNOSIS — M9905 Segmental and somatic dysfunction of pelvic region: Secondary | ICD-10-CM | POA: Diagnosis not present

## 2022-11-18 DIAGNOSIS — M9908 Segmental and somatic dysfunction of rib cage: Secondary | ICD-10-CM | POA: Diagnosis not present

## 2022-11-18 DIAGNOSIS — M531 Cervicobrachial syndrome: Secondary | ICD-10-CM | POA: Diagnosis not present

## 2022-11-18 DIAGNOSIS — M9903 Segmental and somatic dysfunction of lumbar region: Secondary | ICD-10-CM | POA: Diagnosis not present

## 2022-11-25 ENCOUNTER — Ambulatory Visit
Admission: RE | Admit: 2022-11-25 | Discharge: 2022-11-25 | Disposition: A | Discharge status: Home / Self Care | Payer: 59 | Source: Ambulatory Visit | Attending: Nurse Practitioner | Admitting: Nurse Practitioner

## 2022-11-25 ENCOUNTER — Other Ambulatory Visit (INDEPENDENT_AMBULATORY_CARE_PROVIDER_SITE_OTHER): Payer: 59

## 2022-11-25 DIAGNOSIS — Z136 Encounter for screening for cardiovascular disorders: Secondary | ICD-10-CM

## 2022-11-25 DIAGNOSIS — Z1231 Encounter for screening mammogram for malignant neoplasm of breast: Secondary | ICD-10-CM

## 2022-11-25 DIAGNOSIS — Z0000 Encounter for general adult medical examination without abnormal findings: Secondary | ICD-10-CM

## 2022-11-25 DIAGNOSIS — Z1159 Encounter for screening for other viral diseases: Secondary | ICD-10-CM | POA: Diagnosis not present

## 2022-11-25 LAB — COMPREHENSIVE METABOLIC PANEL
ALT: 44 U/L — ABNORMAL HIGH (ref 0–35)
AST: 23 U/L (ref 0–37)
Albumin: 3.8 g/dL (ref 3.5–5.2)
Alkaline Phosphatase: 53 U/L (ref 39–117)
BUN: 11 mg/dL (ref 6–23)
CO2: 24 meq/L (ref 19–32)
Calcium: 8.6 mg/dL (ref 8.4–10.5)
Chloride: 103 meq/L (ref 96–112)
Creatinine, Ser: 0.57 mg/dL (ref 0.40–1.20)
GFR: 112.67 mL/min (ref >60.00)
Glucose, Bld: 92 mg/dL (ref 70–99)
Potassium: 4.2 meq/L (ref 3.5–5.1)
Sodium: 135 meq/L (ref 135–145)
Total Bilirubin: 0.5 mg/dL (ref 0.2–1.2)
Total Protein: 6.7 g/dL (ref 6.0–8.3)

## 2022-11-25 LAB — CBC WITH DIFFERENTIAL/PLATELET
Basophils Absolute: 0 10*3/uL (ref 0.0–0.1)
Basophils Relative: 0.5 % (ref 0.0–3.0)
Eosinophils Absolute: 0.3 10*3/uL (ref 0.0–0.7)
Eosinophils Relative: 2.9 % (ref 0.0–5.0)
HCT: 39.1 % (ref 36.0–46.0)
Hemoglobin: 13 g/dL (ref 12.0–15.0)
Lymphocytes Relative: 25.3 % (ref 12.0–46.0)
Lymphs Abs: 2.6 10*3/uL (ref 0.7–4.0)
MCHC: 33.1 g/dL (ref 30.0–36.0)
MCV: 91.1 fl (ref 78.0–100.0)
Monocytes Absolute: 0.7 10*3/uL (ref 0.1–1.0)
Monocytes Relative: 7.1 % (ref 3.0–12.0)
Neutro Abs: 6.6 10*3/uL (ref 1.4–7.7)
Neutrophils Relative %: 64.2 % (ref 43.0–77.0)
Platelets: 286 10*3/uL (ref 150.0–400.0)
RBC: 4.3 Mil/uL (ref 3.87–5.11)
RDW: 12.8 % (ref 11.5–15.5)
WBC: 10.3 10*3/uL (ref 4.0–10.5)

## 2022-11-25 LAB — LIPID PANEL
Cholesterol: 163 mg/dL (ref 0–200)
HDL: 46.5 mg/dL (ref >39.00)
LDL Cholesterol: 99 mg/dL (ref 0–99)
NonHDL: 116.07
Total CHOL/HDL Ratio: 3
Triglycerides: 83 mg/dL (ref 0.0–149.0)
VLDL: 16.6 mg/dL (ref 0.0–40.0)

## 2022-11-26 LAB — HEPATITIS C ANTIBODY: Hepatitis C Ab: NONREACTIVE

## 2022-11-27 ENCOUNTER — Ambulatory Visit: Payer: 59 | Attending: Nurse Practitioner

## 2022-12-03 DIAGNOSIS — M9905 Segmental and somatic dysfunction of pelvic region: Secondary | ICD-10-CM | POA: Diagnosis not present

## 2022-12-03 DIAGNOSIS — M9902 Segmental and somatic dysfunction of thoracic region: Secondary | ICD-10-CM | POA: Diagnosis not present

## 2022-12-03 DIAGNOSIS — M9901 Segmental and somatic dysfunction of cervical region: Secondary | ICD-10-CM | POA: Diagnosis not present

## 2022-12-03 DIAGNOSIS — M531 Cervicobrachial syndrome: Secondary | ICD-10-CM | POA: Diagnosis not present

## 2022-12-03 DIAGNOSIS — M62830 Muscle spasm of back: Secondary | ICD-10-CM | POA: Diagnosis not present

## 2022-12-03 DIAGNOSIS — M9908 Segmental and somatic dysfunction of rib cage: Secondary | ICD-10-CM | POA: Diagnosis not present

## 2022-12-03 DIAGNOSIS — M9903 Segmental and somatic dysfunction of lumbar region: Secondary | ICD-10-CM | POA: Diagnosis not present

## 2022-12-10 ENCOUNTER — Telehealth: Payer: Self-pay | Admitting: Nurse Practitioner

## 2022-12-10 ENCOUNTER — Ambulatory Visit: Payer: 59 | Admitting: Internal Medicine

## 2022-12-10 NOTE — Telephone Encounter (Signed)
12/10/22 - Pt arrived at 3:54pm for her 3:40pm appt. She had to re-sch. Left without being seen. Pls advise if No Show letter should be sent.

## 2022-12-11 ENCOUNTER — Telehealth: Payer: Self-pay | Admitting: Nurse Practitioner

## 2022-12-11 ENCOUNTER — Encounter: Payer: Self-pay | Admitting: Family Medicine

## 2022-12-11 ENCOUNTER — Ambulatory Visit (INDEPENDENT_AMBULATORY_CARE_PROVIDER_SITE_OTHER): Payer: 59 | Admitting: Family Medicine

## 2022-12-11 VITALS — BP 124/70 | HR 76 | Temp 97.7°F | Wt 143.4 lb

## 2022-12-11 DIAGNOSIS — M62830 Muscle spasm of back: Secondary | ICD-10-CM | POA: Diagnosis not present

## 2022-12-11 DIAGNOSIS — M9905 Segmental and somatic dysfunction of pelvic region: Secondary | ICD-10-CM | POA: Diagnosis not present

## 2022-12-11 DIAGNOSIS — M9902 Segmental and somatic dysfunction of thoracic region: Secondary | ICD-10-CM | POA: Diagnosis not present

## 2022-12-11 DIAGNOSIS — R200 Anesthesia of skin: Secondary | ICD-10-CM | POA: Diagnosis not present

## 2022-12-11 DIAGNOSIS — S92355S Nondisplaced fracture of fifth metatarsal bone, left foot, sequela: Secondary | ICD-10-CM

## 2022-12-11 DIAGNOSIS — M9903 Segmental and somatic dysfunction of lumbar region: Secondary | ICD-10-CM | POA: Diagnosis not present

## 2022-12-11 DIAGNOSIS — M531 Cervicobrachial syndrome: Secondary | ICD-10-CM | POA: Diagnosis not present

## 2022-12-11 DIAGNOSIS — M9908 Segmental and somatic dysfunction of rib cage: Secondary | ICD-10-CM | POA: Diagnosis not present

## 2022-12-11 DIAGNOSIS — M9901 Segmental and somatic dysfunction of cervical region: Secondary | ICD-10-CM | POA: Diagnosis not present

## 2022-12-11 MED ORDER — MELOXICAM 15 MG PO TABS: 15.0000 mg | ORAL_TABLET | Freq: Every day | ORAL | 0 refills | Status: AC | PRN
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
## 2022-12-11 NOTE — Telephone Encounter (Signed)
2nd missed visit  02/27/2022 no show 12/10/2022 arrived 14 min late  Final warning sent via mail and mychart

## 2022-12-11 NOTE — Telephone Encounter (Signed)
Error

## 2022-12-11 NOTE — Patient Instructions (Addendum)
Avoid putting too much pressure on your injured foot to help with pain and numbness. Take the prescribed meloxicam for pain management. Schedule an MRI for your foot and ankle and orthopedist. Keep your phone available for a call from the imaging center and orthopedics to schedule your appointments.  Young Berry t?o p l?c qu l?n ln bn chn b? th??ng ?? gi?m ?au v t. Dng meloxicam theo quy ??nh ?? ki?m sot c?n ?au. Ln l?ch ch?p MRI cho bn chn, m?t c chn v bc s? ch?nh hnh c?a b?n. Gi? ?i?n tho?i c?a b?n s?n sng ?? th?c hi?n cu?c g?i t? trung tm ch?n ?on hnh ?nh v ch?nh hnh ?? ln l?ch h?n.

## 2022-12-13 DIAGNOSIS — S92355S Nondisplaced fracture of fifth metatarsal bone, left foot, sequela: Secondary | ICD-10-CM | POA: Insufficient documentation

## 2022-12-13 DIAGNOSIS — R200 Anesthesia of skin: Secondary | ICD-10-CM | POA: Insufficient documentation

## 2022-12-13 NOTE — Assessment & Plan Note (Signed)
Patient presents with persistent numbness and pain in the left foot post-fracture. Differential diagnosis: Residual nerve damage from fracture Possible tarsal tunnel syndrome  Plan: Prescribe meloxicam 15mg  as needed for pain. Order MRI of the left ankle and foot to assess for further structural and potential nerve damage. Referral to orthopedics for comprehensive management and treatment.

## 2022-12-18 ENCOUNTER — Other Ambulatory Visit: Payer: Self-pay | Admitting: Nurse Practitioner

## 2022-12-18 ENCOUNTER — Other Ambulatory Visit (INDEPENDENT_AMBULATORY_CARE_PROVIDER_SITE_OTHER): Payer: 59

## 2022-12-18 ENCOUNTER — Ambulatory Visit (INDEPENDENT_AMBULATORY_CARE_PROVIDER_SITE_OTHER): Payer: 59 | Admitting: Family

## 2022-12-18 DIAGNOSIS — M79671 Pain in right foot: Secondary | ICD-10-CM

## 2022-12-18 DIAGNOSIS — M79672 Pain in left foot: Secondary | ICD-10-CM

## 2022-12-18 DIAGNOSIS — S92355S Nondisplaced fracture of fifth metatarsal bone, left foot, sequela: Secondary | ICD-10-CM

## 2022-12-18 DIAGNOSIS — G5763 Lesion of plantar nerve, bilateral lower limbs: Secondary | ICD-10-CM | POA: Diagnosis not present

## 2022-12-18 DIAGNOSIS — R200 Anesthesia of skin: Secondary | ICD-10-CM

## 2022-12-18 MED ORDER — PREDNISONE 20 MG PO TABS: 20.0000 mg | ORAL_TABLET | Freq: Every day | ORAL | 0 refills | Status: DC

## 2022-12-18 NOTE — Progress Notes (Signed)
MRI Order changed to [facility] per insurance preference.

## 2022-12-20 NOTE — Progress Notes (Signed)
Office Visit Note   Patient: Jessica Perry           Date of Birth: 1980-09-30           MRN: [id number] Visit Date: 12/18/2022              Requested by: Garnette Gunner, MD [address] [place],  [place] [address] PCP: Gerre Scull, NP  Chief Complaint  Patient presents with   Left Foot - Pain      HPI: The patient is a 42 year old woman who presents today with 2 separate issues she is having pain laterally over the base of the fifth metatarsal where she had a fracture 4 months ago.  Aching pain with prolonged standing prolonged weightbearing no swelling no reinjury  She is also having some pain and numbness over the forefoot complains of numbness beneath the metatarsal heads bilateral feet especially with prolonged standing walking denies wearing heels or tight shoe wear reports this has been gradually worsening for the last several months  Assessment & Plan: Visit Diagnoses:  1. Bilateral foot pain     Plan: Neuroma pain bilateral feet offered Depo-Medrol injection patient like to old off on this. she is also having tenderness over the base of the fifth metatarsal, presumed peroneal tendinitis on the left.  Placed on course of oral prednisone  Follow-Up Instructions: No follow-ups on file.   Ortho Exam  Patient is alert, oriented, no adenopathy, well-dressed, normal affect, normal respiratory effort. Tenderness along the course the peroneal tendon left foot.  There is also bilateral tenderness in the third webspace as well as pain with lateral compression of the metatarsal heads.  This reproduces her symptoms.  Imaging: No results found. No images are attached to the encounter.  Labs: No results found for: "HGBA1C", "ESRSEDRATE", "CRP", "LABURIC", "REPTSTATUS", "GRAMSTAIN", "CULT", "LABORGA"   Lab Results  Component Value Date   ALBUMIN 3.8 11/25/2022    No results found for: "MG" No results found for: "VD25OH"  No results found for:  "PREALBUMIN"    Latest Ref Rng & Units 11/25/2022   10:33 AM 01/29/2021   12:48 PM 06/25/2020    7:52 PM  CBC EXTENDED  WBC 4.0 - 10.5 K/uL 10.3  7.2  13.1   RBC 3.87 - 5.11 Mil/uL 4.30  4.21  4.56   Hemoglobin 12.0 - 15.0 g/dL 65.7  84.6  96.2   HCT 36.0 - 46.0 % 39.1  39.0  42.1   Platelets 150.0 - 400.0 K/uL 286.0  233  316   NEUT# 1.4 - 7.7 K/uL 6.6  4.0  8.9   Lymph# 0.7 - 4.0 K/uL 2.6  2.1  2.8      There is no height or weight on file to calculate BMI.  Orders:  Orders Placed This Encounter  Procedures   XR Foot 2 Views Left   XR Foot 2 Views Right   Meds ordered this encounter  Medications   predniSONE (DELTASONE) 20 MG tablet    Sig: Take 1 tablet (20 mg total) by mouth daily with breakfast.    Dispense:  20 tablet    Refill:  0     Procedures: No procedures performed  Clinical Data: No additional findings.  ROS:  All other systems negative, except as noted in the HPI. Review of Systems  Constitutional: Negative.   Musculoskeletal:  Positive for arthralgias and gait problem.  Neurological:  Positive for numbness. Negative for weakness.    Objective: Vital  Signs: LMP 11/27/2022   Specialty Comments:  No specialty comments available.  PMFS History: Patient Active Problem List   Diagnosis Date Noted   Numbness of left lower extremity 12/13/2022   Nondisplaced fracture of fifth metatarsal bone, left foot, sequela 12/13/2022   Pain in both feet 10/29/2022   Routine general medical examination at a health care facility 10/29/2022   Anxiety and depression 01/30/2022   Neck pain 01/30/2022   Moderate persistent asthma without complication 01/30/2022   Shortness of breath 02/07/2021   Past Medical History:  Diagnosis Date   Asthma    Gestational diabetes     Family History  Problem Relation Age of Onset   Healthy Mother    Healthy Father    Heart disease Maternal Grandmother    Hypertension Maternal Grandmother    Diabetes Maternal  Grandmother    Breast cancer Neg Hx     Past Surgical History:  Procedure Laterality Date   TUBAL LIGATION  11/22/2011   Procedure: POST PARTUM TUBAL LIGATION;  Surgeon: Loney Laurence, MD;  Location: [facility];  Service: Gynecology;  Laterality: Bilateral;   Social History   Occupational History   Not on file  Tobacco Use   Smoking status: Never   Smokeless tobacco: Never  Vaping Use   Vaping status: Never Used  Substance and Sexual Activity   Alcohol use: Never   Drug use: Not Currently   Sexual activity: Yes    Birth control/protection: Surgical

## 2022-12-25 DIAGNOSIS — M9902 Segmental and somatic dysfunction of thoracic region: Secondary | ICD-10-CM | POA: Diagnosis not present

## 2022-12-25 DIAGNOSIS — M9903 Segmental and somatic dysfunction of lumbar region: Secondary | ICD-10-CM | POA: Diagnosis not present

## 2022-12-25 DIAGNOSIS — M62830 Muscle spasm of back: Secondary | ICD-10-CM | POA: Diagnosis not present

## 2022-12-25 DIAGNOSIS — M9908 Segmental and somatic dysfunction of rib cage: Secondary | ICD-10-CM | POA: Diagnosis not present

## 2022-12-25 DIAGNOSIS — M531 Cervicobrachial syndrome: Secondary | ICD-10-CM | POA: Diagnosis not present

## 2022-12-25 DIAGNOSIS — M9901 Segmental and somatic dysfunction of cervical region: Secondary | ICD-10-CM | POA: Diagnosis not present

## 2022-12-25 DIAGNOSIS — M9905 Segmental and somatic dysfunction of pelvic region: Secondary | ICD-10-CM | POA: Diagnosis not present

## 2022-12-31 ENCOUNTER — Ambulatory Visit: Payer: 59 | Admitting: Nurse Practitioner

## 2022-12-31 ENCOUNTER — Telehealth: Payer: Self-pay | Admitting: Nurse Practitioner

## 2022-12-31 NOTE — Telephone Encounter (Signed)
02/27/2022 no show 12/10/2022 14  min late, pt rescheduled and was seen 10/2 w/Dr. Janee Morn 12/31/2022 no show  Please advise if you want another final warning or dismissal.

## 2022-12-31 NOTE — Telephone Encounter (Signed)
Dismissal generated in [place]

## 2022-12-31 NOTE — Telephone Encounter (Signed)
10.22.24 no show / no show letter sent

## 2023-01-13 DIAGNOSIS — M9902 Segmental and somatic dysfunction of thoracic region: Secondary | ICD-10-CM | POA: Diagnosis not present

## 2023-01-13 DIAGNOSIS — M9903 Segmental and somatic dysfunction of lumbar region: Secondary | ICD-10-CM | POA: Diagnosis not present

## 2023-01-13 DIAGNOSIS — M531 Cervicobrachial syndrome: Secondary | ICD-10-CM | POA: Diagnosis not present

## 2023-01-13 DIAGNOSIS — M9901 Segmental and somatic dysfunction of cervical region: Secondary | ICD-10-CM | POA: Diagnosis not present

## 2023-01-13 DIAGNOSIS — M62830 Muscle spasm of back: Secondary | ICD-10-CM | POA: Diagnosis not present

## 2023-01-13 DIAGNOSIS — M9908 Segmental and somatic dysfunction of rib cage: Secondary | ICD-10-CM | POA: Diagnosis not present

## 2023-01-13 DIAGNOSIS — M9905 Segmental and somatic dysfunction of pelvic region: Secondary | ICD-10-CM | POA: Diagnosis not present

## 2023-01-16 ENCOUNTER — Ambulatory Visit: Payer: 59 | Admitting: Orthopedic Surgery

## 2023-01-16 ENCOUNTER — Encounter: Payer: Self-pay | Admitting: Orthopedic Surgery

## 2023-01-16 DIAGNOSIS — M2550 Pain in unspecified joint: Secondary | ICD-10-CM

## 2023-01-16 DIAGNOSIS — M79672 Pain in left foot: Secondary | ICD-10-CM

## 2023-01-16 DIAGNOSIS — M79642 Pain in left hand: Secondary | ICD-10-CM | POA: Diagnosis not present

## 2023-01-16 DIAGNOSIS — M79641 Pain in right hand: Secondary | ICD-10-CM

## 2023-01-16 DIAGNOSIS — M79671 Pain in right foot: Secondary | ICD-10-CM

## 2023-01-16 NOTE — Progress Notes (Signed)
Office Visit Note   Patient: Jessica Perry           Date of Birth: March 25, 1980           MRN: [id number] Visit Date: 01/16/2023              Requested by: No referring provider defined for this encounter. PCP: No primary care provider on file.  Chief Complaint  Patient presents with   Right Foot - Pain   Left Foot - Pain      HPI: Patient is a 43 year old woman who is seen in follow-up for bilateral foot pain as well as bilateral hand pain.  Patient states she also has pain over the base of the fifth metatarsal on the left.  She has states that her hand pain is in the hypothenar eminence bilaterally but not in her fingers.  Patient states that the prednisone made her feet and hands feel worse.  She states that both are burning and painful.  Patient states she has a history of back and neck pain and she states she was told she had a bone missing in her neck.  Assessment & Plan: Visit Diagnoses:  1. Multiple joint pain   2. Bilateral foot pain   3. Bilateral hand pain     Plan: Will obtain a rheumatologic screen and called with results plan to follow-up in 4 weeks.  Follow-Up Instructions: No follow-ups on file.   Ortho Exam  Patient is alert, oriented, no adenopathy, well-dressed, normal affect, normal respiratory effort. Patient's CT scan is reviewed of her head and neck obtained in July of this year.  The CT scans show no bony abnormalities.  Radiographs of her feet show no signs of a metatarsal fracture.  Patient has full range of motion of her wrist and fingers.  She has pain in the hypothenar eminence of both hands.  No carpal tunnel symptoms.  Examination of her feet she has generalized burning pain over the dorsum of both feet.  No evidence of a focal neuroma pain.  Imaging: No results found. No images are attached to the encounter.  Labs: No results found for: "HGBA1C", "ESRSEDRATE", "CRP", "LABURIC", "REPTSTATUS", "GRAMSTAIN", "CULT", "LABORGA"   Lab Results   Component Value Date   ALBUMIN 3.8 11/25/2022    No results found for: "MG" No results found for: "VD25OH"  No results found for: "PREALBUMIN"    Latest Ref Rng & Units 11/25/2022   10:33 AM 01/29/2021   12:48 PM 06/25/2020    7:52 PM  CBC EXTENDED  WBC 4.0 - 10.5 K/uL 10.3  7.2  13.1   RBC 3.87 - 5.11 Mil/uL 4.30  4.21  4.56   Hemoglobin 12.0 - 15.0 g/dL 32.4  40.1  02.7   HCT 36.0 - 46.0 % 39.1  39.0  42.1   Platelets 150.0 - 400.0 K/uL 286.0  233  316   NEUT# 1.4 - 7.7 K/uL 6.6  4.0  8.9   Lymph# 0.7 - 4.0 K/uL 2.6  2.1  2.8      There is no height or weight on file to calculate BMI.  Orders:  Orders Placed This Encounter  Procedures   Uric acid   Sed Rate (ESR)   Antinuclear Antib (ANA)   Rheumatoid Factor   No orders of the defined types were placed in this encounter.    Procedures: No procedures performed  Clinical Data: No additional findings.  ROS:  All other systems negative, except as noted in  the HPI. Review of Systems  Objective: Vital Signs: There were no vitals taken for this visit.  Specialty Comments:  No specialty comments available.  PMFS History: Patient Active Problem List   Diagnosis Date Noted   Numbness of left lower extremity 12/13/2022   Nondisplaced fracture of fifth metatarsal bone, left foot, sequela 12/13/2022   Pain in both feet 10/29/2022   Routine general medical examination at a health care facility 10/29/2022   Anxiety and depression 01/30/2022   Neck pain 01/30/2022   Moderate persistent asthma without complication 01/30/2022   Shortness of breath 02/07/2021   Past Medical History:  Diagnosis Date   Asthma    Gestational diabetes     Family History  Problem Relation Age of Onset   Healthy Mother    Healthy Father    Heart disease Maternal Grandmother    Hypertension Maternal Grandmother    Diabetes Maternal Grandmother    Breast cancer Neg Hx     Past Surgical History:  Procedure Laterality Date    TUBAL LIGATION  11/22/2011   Procedure: POST PARTUM TUBAL LIGATION;  Surgeon: Loney Laurence, MD;  Location: [facility];  Service: Gynecology;  Laterality: Bilateral;   Social History   Occupational History   Not on file  Tobacco Use   Smoking status: Never   Smokeless tobacco: Never  Vaping Use   Vaping status: Never Used  Substance and Sexual Activity   Alcohol use: Never   Drug use: Not Currently   Sexual activity: Yes    Birth control/protection: Surgical

## 2023-01-17 LAB — URIC ACID: Uric Acid, Serum: 4 mg/dL (ref 2.5–7.0)

## 2023-01-17 LAB — RHEUMATOID FACTOR: Rheumatoid fact SerPl-aCnc: <10 [IU]/mL (ref <14)

## 2023-01-17 LAB — SEDIMENTATION RATE: Sed Rate: 14 mm/h (ref 0–20)

## 2023-01-17 LAB — ANA: Anti Nuclear Antibody (ANA): NEGATIVE

## 2023-01-28 ENCOUNTER — Telehealth: Payer: Self-pay | Admitting: Orthopedic Surgery

## 2023-01-28 NOTE — Telephone Encounter (Signed)
Nadara Mustard, MD  Javier Glazier Patient's rheumatology screen and gout screen were negative.  Recommend reevaluate in 4 weeks.   Called pt, no answer, left VM. She does have a follow up appt scheduled.

## 2023-02-03 DIAGNOSIS — M531 Cervicobrachial syndrome: Secondary | ICD-10-CM | POA: Diagnosis not present

## 2023-02-03 DIAGNOSIS — M9901 Segmental and somatic dysfunction of cervical region: Secondary | ICD-10-CM | POA: Diagnosis not present

## 2023-02-03 DIAGNOSIS — M9902 Segmental and somatic dysfunction of thoracic region: Secondary | ICD-10-CM | POA: Diagnosis not present

## 2023-02-03 DIAGNOSIS — M62830 Muscle spasm of back: Secondary | ICD-10-CM | POA: Diagnosis not present

## 2023-02-03 DIAGNOSIS — M9905 Segmental and somatic dysfunction of pelvic region: Secondary | ICD-10-CM | POA: Diagnosis not present

## 2023-02-03 DIAGNOSIS — M9903 Segmental and somatic dysfunction of lumbar region: Secondary | ICD-10-CM | POA: Diagnosis not present

## 2023-02-03 DIAGNOSIS — M9908 Segmental and somatic dysfunction of rib cage: Secondary | ICD-10-CM | POA: Diagnosis not present

## 2023-02-04 ENCOUNTER — Other Ambulatory Visit (INDEPENDENT_AMBULATORY_CARE_PROVIDER_SITE_OTHER): Payer: Self-pay

## 2023-02-04 ENCOUNTER — Ambulatory Visit: Payer: 59 | Admitting: Orthopedic Surgery

## 2023-02-04 DIAGNOSIS — M5441 Lumbago with sciatica, right side: Secondary | ICD-10-CM

## 2023-02-04 DIAGNOSIS — M5412 Radiculopathy, cervical region: Secondary | ICD-10-CM

## 2023-02-04 DIAGNOSIS — M5442 Lumbago with sciatica, left side: Secondary | ICD-10-CM

## 2023-02-04 DIAGNOSIS — M79671 Pain in right foot: Secondary | ICD-10-CM

## 2023-02-04 DIAGNOSIS — G8929 Other chronic pain: Secondary | ICD-10-CM

## 2023-02-04 DIAGNOSIS — M542 Cervicalgia: Secondary | ICD-10-CM

## 2023-02-04 DIAGNOSIS — M79672 Pain in left foot: Secondary | ICD-10-CM

## 2023-02-10 DIAGNOSIS — M9903 Segmental and somatic dysfunction of lumbar region: Secondary | ICD-10-CM | POA: Diagnosis not present

## 2023-02-10 DIAGNOSIS — M62830 Muscle spasm of back: Secondary | ICD-10-CM | POA: Diagnosis not present

## 2023-02-10 DIAGNOSIS — M9905 Segmental and somatic dysfunction of pelvic region: Secondary | ICD-10-CM | POA: Diagnosis not present

## 2023-02-10 DIAGNOSIS — M9901 Segmental and somatic dysfunction of cervical region: Secondary | ICD-10-CM | POA: Diagnosis not present

## 2023-02-10 DIAGNOSIS — M9908 Segmental and somatic dysfunction of rib cage: Secondary | ICD-10-CM | POA: Diagnosis not present

## 2023-02-10 DIAGNOSIS — M9902 Segmental and somatic dysfunction of thoracic region: Secondary | ICD-10-CM | POA: Diagnosis not present

## 2023-02-10 DIAGNOSIS — M531 Cervicobrachial syndrome: Secondary | ICD-10-CM | POA: Diagnosis not present

## 2023-02-11 ENCOUNTER — Encounter: Payer: Self-pay | Admitting: Orthopedic Surgery

## 2023-02-11 NOTE — Progress Notes (Signed)
Office Visit Note   Patient: Jessica Perry           Date of Birth: 07-22-1980           MRN: [id number] Visit Date: 02/04/2023              Requested by: No referring provider defined for this encounter. PCP: No primary care provider on file.  Chief Complaint  Patient presents with   Right Foot - Pain   Left Foot - Pain      HPI: Patient is a 42 year old woman who presents follow-up with bilateral foot pain.  Her rheumatology screen was negative.  Patient states she has pain and numbness off and on worse with walking also in her hands both are numb.  Patient states she was on prednisone for her symptoms she states she also has a history of neck pain.  Assessment & Plan: Visit Diagnoses:  1. Neck pain   2. Chronic bilateral low back pain with bilateral sciatica   3. Bilateral foot pain     Plan: Will obtain a cervical spine MRI scan and follow-up after this is obtained.  Follow-Up Instructions: Return if symptoms worsen or fail to improve.   Ortho Exam  Patient is alert, oriented, no adenopathy, well-dressed, normal affect, normal respiratory effort. Examination patient has no focal motor weakness in either upper or lower extremity.  Her feet are numb bilaterally she has numbness in the palm of her hands bilaterally.  She has no tenderness to palpation over the carpal tunnel no tenderness or reproduced pain in the median nerve distribution with wrist flexion.  Imaging: No results found. No images are attached to the encounter.  Labs: Lab Results  Component Value Date   ESRSEDRATE 14 01/16/2023   LABURIC 4.0 01/16/2023     Lab Results  Component Value Date   ALBUMIN 3.8 11/25/2022    No results found for: "MG" No results found for: "VD25OH"  No results found for: "PREALBUMIN"    Latest Ref Rng & Units 11/25/2022   10:33 AM 01/29/2021   12:48 PM 06/25/2020    7:52 PM  CBC EXTENDED  WBC 4.0 - 10.5 K/uL 10.3  7.2  13.1   RBC 3.87 - 5.11 Mil/uL 4.30  4.21  4.56    Hemoglobin 12.0 - 15.0 g/dL 78.2  95.6  21.3   HCT 36.0 - 46.0 % 39.1  39.0  42.1   Platelets 150.0 - 400.0 K/uL 286.0  233  316   NEUT# 1.4 - 7.7 K/uL 6.6  4.0  8.9   Lymph# 0.7 - 4.0 K/uL 2.6  2.1  2.8      There is no height or weight on file to calculate BMI.  Orders:  Orders Placed This Encounter  Procedures   XR Cervical Spine 2 or 3 views   XR Lumbar Spine 2-3 Views   No orders of the defined types were placed in this encounter.    Procedures: No procedures performed  Clinical Data: No additional findings.  ROS:  All other systems negative, except as noted in the HPI. Review of Systems  Objective: Vital Signs: There were no vitals taken for this visit.  Specialty Comments:  No specialty comments available.  PMFS History: Patient Active Problem List   Diagnosis Date Noted   Numbness of left lower extremity 12/13/2022   Nondisplaced fracture of fifth metatarsal bone, left foot, sequela 12/13/2022   Pain in both feet 10/29/2022   Routine general medical examination  at a health care facility 10/29/2022   Anxiety and depression 01/30/2022   Neck pain 01/30/2022   Moderate persistent asthma without complication 01/30/2022   Shortness of breath 02/07/2021   Past Medical History:  Diagnosis Date   Asthma    Gestational diabetes     Family History  Problem Relation Age of Onset   Healthy Mother    Healthy Father    Heart disease Maternal Grandmother    Hypertension Maternal Grandmother    Diabetes Maternal Grandmother    Breast cancer Neg Hx     Past Surgical History:  Procedure Laterality Date   TUBAL LIGATION  11/22/2011   Procedure: POST PARTUM TUBAL LIGATION;  Surgeon: Loney Laurence, MD;  Location: [facility];  Service: Gynecology;  Laterality: Bilateral;   Social History   Occupational History   Not on file  Tobacco Use   Smoking status: Never   Smokeless tobacco: Never  Vaping Use   Vaping status: Never Used  Substance and Sexual  Activity   Alcohol use: Never   Drug use: Not Currently   Sexual activity: Yes    Birth control/protection: Surgical

## 2023-02-12 ENCOUNTER — Encounter: Payer: Self-pay | Admitting: Orthopedic Surgery

## 2023-02-12 ENCOUNTER — Encounter (HOSPITAL_COMMUNITY): Payer: Self-pay | Admitting: Student

## 2023-02-13 ENCOUNTER — Encounter: Payer: Self-pay | Admitting: Orthopedic Surgery

## 2023-02-14 ENCOUNTER — Other Ambulatory Visit: Payer: 59

## 2023-02-14 DIAGNOSIS — M531 Cervicobrachial syndrome: Secondary | ICD-10-CM | POA: Diagnosis not present

## 2023-02-14 DIAGNOSIS — M9902 Segmental and somatic dysfunction of thoracic region: Secondary | ICD-10-CM | POA: Diagnosis not present

## 2023-02-14 DIAGNOSIS — M62830 Muscle spasm of back: Secondary | ICD-10-CM | POA: Diagnosis not present

## 2023-02-14 DIAGNOSIS — M9908 Segmental and somatic dysfunction of rib cage: Secondary | ICD-10-CM | POA: Diagnosis not present

## 2023-02-14 DIAGNOSIS — M9903 Segmental and somatic dysfunction of lumbar region: Secondary | ICD-10-CM | POA: Diagnosis not present

## 2023-02-14 DIAGNOSIS — M9901 Segmental and somatic dysfunction of cervical region: Secondary | ICD-10-CM | POA: Diagnosis not present

## 2023-02-14 DIAGNOSIS — M9905 Segmental and somatic dysfunction of pelvic region: Secondary | ICD-10-CM | POA: Diagnosis not present

## 2023-02-17 ENCOUNTER — Ambulatory Visit: Payer: 59 | Admitting: Orthopedic Surgery

## 2023-02-17 DIAGNOSIS — M9901 Segmental and somatic dysfunction of cervical region: Secondary | ICD-10-CM | POA: Diagnosis not present

## 2023-02-17 DIAGNOSIS — M9903 Segmental and somatic dysfunction of lumbar region: Secondary | ICD-10-CM | POA: Diagnosis not present

## 2023-02-17 DIAGNOSIS — M9902 Segmental and somatic dysfunction of thoracic region: Secondary | ICD-10-CM | POA: Diagnosis not present

## 2023-02-17 DIAGNOSIS — M9908 Segmental and somatic dysfunction of rib cage: Secondary | ICD-10-CM | POA: Diagnosis not present

## 2023-02-17 DIAGNOSIS — M9905 Segmental and somatic dysfunction of pelvic region: Secondary | ICD-10-CM | POA: Diagnosis not present

## 2023-02-17 DIAGNOSIS — M62830 Muscle spasm of back: Secondary | ICD-10-CM | POA: Diagnosis not present

## 2023-02-17 DIAGNOSIS — M531 Cervicobrachial syndrome: Secondary | ICD-10-CM | POA: Diagnosis not present

## 2023-02-19 DIAGNOSIS — M9903 Segmental and somatic dysfunction of lumbar region: Secondary | ICD-10-CM | POA: Diagnosis not present

## 2023-02-19 DIAGNOSIS — M9905 Segmental and somatic dysfunction of pelvic region: Secondary | ICD-10-CM | POA: Diagnosis not present

## 2023-02-19 DIAGNOSIS — M9902 Segmental and somatic dysfunction of thoracic region: Secondary | ICD-10-CM | POA: Diagnosis not present

## 2023-02-19 DIAGNOSIS — M62830 Muscle spasm of back: Secondary | ICD-10-CM | POA: Diagnosis not present

## 2023-02-19 DIAGNOSIS — M531 Cervicobrachial syndrome: Secondary | ICD-10-CM | POA: Diagnosis not present

## 2023-02-19 DIAGNOSIS — M9901 Segmental and somatic dysfunction of cervical region: Secondary | ICD-10-CM | POA: Diagnosis not present

## 2023-02-19 DIAGNOSIS — M9908 Segmental and somatic dysfunction of rib cage: Secondary | ICD-10-CM | POA: Diagnosis not present

## 2023-04-15 DIAGNOSIS — M9908 Segmental and somatic dysfunction of rib cage: Secondary | ICD-10-CM | POA: Diagnosis not present

## 2023-04-15 DIAGNOSIS — M9902 Segmental and somatic dysfunction of thoracic region: Secondary | ICD-10-CM | POA: Diagnosis not present

## 2023-04-15 DIAGNOSIS — M9903 Segmental and somatic dysfunction of lumbar region: Secondary | ICD-10-CM | POA: Diagnosis not present

## 2023-04-15 DIAGNOSIS — M62830 Muscle spasm of back: Secondary | ICD-10-CM | POA: Diagnosis not present

## 2023-04-15 DIAGNOSIS — M9905 Segmental and somatic dysfunction of pelvic region: Secondary | ICD-10-CM | POA: Diagnosis not present

## 2023-04-15 DIAGNOSIS — M9901 Segmental and somatic dysfunction of cervical region: Secondary | ICD-10-CM | POA: Diagnosis not present

## 2023-04-15 DIAGNOSIS — M531 Cervicobrachial syndrome: Secondary | ICD-10-CM | POA: Diagnosis not present

## 2023-05-06 DIAGNOSIS — M9902 Segmental and somatic dysfunction of thoracic region: Secondary | ICD-10-CM | POA: Diagnosis not present

## 2023-05-06 DIAGNOSIS — M9905 Segmental and somatic dysfunction of pelvic region: Secondary | ICD-10-CM | POA: Diagnosis not present

## 2023-05-06 DIAGNOSIS — M531 Cervicobrachial syndrome: Secondary | ICD-10-CM | POA: Diagnosis not present

## 2023-05-06 DIAGNOSIS — M9901 Segmental and somatic dysfunction of cervical region: Secondary | ICD-10-CM | POA: Diagnosis not present

## 2023-05-06 DIAGNOSIS — M9908 Segmental and somatic dysfunction of rib cage: Secondary | ICD-10-CM | POA: Diagnosis not present

## 2023-05-06 DIAGNOSIS — M62830 Muscle spasm of back: Secondary | ICD-10-CM | POA: Diagnosis not present

## 2023-05-06 DIAGNOSIS — M9903 Segmental and somatic dysfunction of lumbar region: Secondary | ICD-10-CM | POA: Diagnosis not present

## 2023-07-11 ENCOUNTER — Other Ambulatory Visit: Payer: Self-pay

## 2023-07-11 ENCOUNTER — Ambulatory Visit
Admission: RE | Admit: 2023-07-11 | Discharge: 2023-07-11 | Disposition: A | Discharge status: Home / Self Care | Source: Ambulatory Visit | Attending: Physician Assistant | Admitting: Physician Assistant

## 2023-07-11 VITALS — BP 106/72 | HR 78 | Temp 98.8°F | Resp 18 | Ht 66.0 in | Wt 130.0 lb

## 2023-07-11 DIAGNOSIS — R0789 Other chest pain: Secondary | ICD-10-CM | POA: Diagnosis not present

## 2023-07-11 DIAGNOSIS — J4521 Mild intermittent asthma with (acute) exacerbation: Secondary | ICD-10-CM | POA: Diagnosis not present

## 2023-07-11 DIAGNOSIS — R0602 Shortness of breath: Secondary | ICD-10-CM | POA: Diagnosis not present

## 2023-07-11 MED ORDER — ALBUTEROL SULFATE HFA 108 (90 BASE) MCG/ACT IN AERS: 1.0000 | INHALATION_SPRAY | Freq: Four times a day (QID) | RESPIRATORY_TRACT | 1 refills | Status: DC | PRN

## 2023-07-11 MED ORDER — FLUTICASONE PROPIONATE HFA 110 MCG/ACT IN AERO: 2.0000 | INHALATION_SPRAY | Freq: Two times a day (BID) | RESPIRATORY_TRACT | 0 refills | Status: DC

## 2023-07-11 MED ORDER — IPRATROPIUM-ALBUTEROL 0.5-2.5 (3) MG/3ML IN SOLN
3.0000 mL | Freq: Once | RESPIRATORY_TRACT | Status: AC
  Administered 2023-07-11: 3 mL via RESPIRATORY_TRACT

## 2023-07-11 NOTE — Discharge Instructions (Addendum)
 You were seen today for concerns of shortness of breath and chest.  At this time I suspect you are likely having an asthma flare. This typically will involve symptoms similar to what you are experiencing such as chest tightness, difficulty breathing, coughing.  This is usually relieved with inhalers and other medications to help open up your airways and allow you to breathe a little bit easier.  I have sent in a medication called Flovent.  This is an inhaler that I want you to take 2 puffs in the morning and 2 puffs in the evening every day for at least 2 weeks to help calm down your symptoms.  While you are taking this you can also use a rescue inhaler that I sent in.  You can use your rescue inhaler up to every 6 hours as needed for symptoms.  If you do not need to take it please do not do so unless you are having trouble breathing, coughing or chest tightness. Please make sure that you are reducing allergens in your home dusting and cleaning your linens regularly.  Please also reduce other triggers such as pet dander, pollen or dust as these can cause difficulty breathing as well. If you feel like your symptoms are getting worse or not improving you can always return here or follow-up with your PCP.  For more severe symptoms as severe difficulty breathing, passing out, severe chest pain, severe coughing please go to the emergency room as these could be signs of a medical emergency.

## 2023-07-11 NOTE — ED Triage Notes (Signed)
 Pt presents with complaints of shortness of breath x 3 weeks. Pt states she feels SOB at rest and on exertion. One nebulizer treatment at home with temporary relief. Pt currently denies pain.

## 2023-07-11 NOTE — ED Provider Notes (Signed)
 [facility]    CSN: [id number] Arrival date & time: 07/11/23  1014      History   Chief Complaint Chief Complaint  Patient presents with   Shortness of Breath    HPI Jessica Perry is a 43 y.o. female.   HPI  She states she is having SOB and difficulty taking deep breaths She states this has been ongoing for about 3 weeks  She reports she has had something similar in the past but is not sure what it was  She reports she has had to use nebulizers and inhalers in the past but no longer has these medications     Past Medical History:  Diagnosis Date   Asthma    Gestational diabetes     Patient Active Problem List   Diagnosis Date Noted   Numbness of left lower extremity 12/13/2022   Nondisplaced fracture of fifth metatarsal bone, left foot, sequela 12/13/2022   Pain in both feet 10/29/2022   Routine general medical examination at a health care facility 10/29/2022   Anxiety and depression 01/30/2022   Neck pain 01/30/2022   Moderate persistent asthma without complication 01/30/2022   Shortness of breath 02/07/2021    Past Surgical History:  Procedure Laterality Date   TUBAL LIGATION  11/22/2011   Procedure: POST PARTUM TUBAL LIGATION;  Surgeon: Oddis Bench, MD;  Location: [facility];  Service: Gynecology;  Laterality: Bilateral;    OB History     Gravida  3   Para  2   Term  2   Preterm  0   AB  1   Living  2      SAB  1   IAB  0   Ectopic  0   Multiple  0   Live Births  2            Home Medications    Prior to Admission medications   Medication Sig Start Date End Date Taking? Authorizing Provider  albuterol  (VENTOLIN  HFA) 108 (90 Base) MCG/ACT inhaler Inhale 1-2 puffs into the lungs every 6 (six) hours as needed for wheezing or shortness of breath. 07/11/23  Yes Weslie Rasmus E, PA-C  fluticasone  (FLOVENT HFA) 110 MCG/ACT inhaler Inhale 2 puffs into the lungs in the morning and at bedtime. 07/11/23  Yes Adiah Guereca E, PA-C   cyclobenzaprine  (FLEXERIL ) 10 MG tablet Take 1 tablet (10 mg total) by mouth 3 (three) times daily as needed for muscle spasms (muscle tightness). Patient not taking: Reported on 10/29/2022 01/30/22   Odette Benjamin, NP  meloxicam  (MOBIC ) 15 MG tablet Take 1 tablet (15 mg total) by mouth daily as needed for pain (neck pain). 12/11/22   Catheryn Cluck, MD  predniSONE  (DELTASONE ) 20 MG tablet Take 1 tablet (20 mg total) by mouth daily with breakfast. 12/18/22   Reuben Castilla, NP    Family History Family History  Problem Relation Age of Onset   Healthy Mother    Healthy Father    Heart disease Maternal Grandmother    Hypertension Maternal Grandmother    Diabetes Maternal Grandmother    Breast cancer Neg Hx     Social History Social History   Tobacco Use   Smoking status: Never   Smokeless tobacco: Never  Vaping Use   Vaping status: Never Used  Substance Use Topics   Alcohol use: Never   Drug use: Not Currently     Allergies   Patient has no known allergies.  Review of Systems Review of Systems  Constitutional:  Positive for fatigue. Negative for chills and fever.  Respiratory:  Positive for chest tightness, shortness of breath and wheezing. Negative for cough.   Cardiovascular:  Negative for chest pain and palpitations.     Physical Exam Triage Vital Signs ED Triage Vitals  Encounter Vitals Group     BP 07/11/23 1026 106/72     Systolic BP Percentile --      Diastolic BP Percentile --      Pulse Rate 07/11/23 1026 78     Resp 07/11/23 1026 18     Temp 07/11/23 1026 98.8 F (37.1 C)     Temp Source 07/11/23 1026 Temporal     SpO2 07/11/23 1026 96 %     Weight 07/11/23 1028 130 lb (59 kg)     Height 07/11/23 1028 5\' 6"  (1.676 m)     Head Circumference --      Peak Flow --      Pain Score 07/11/23 1028 0     Pain Loc --      Pain Education --      Exclude from Growth Chart --    No data found.  Updated Vital Signs BP 106/72 (BP Location: Right Arm)    Pulse 78   Temp 98.8 F (37.1 C) (Temporal)   Resp 18   Ht 5\' 6"  (1.676 m)   Wt 130 lb (59 kg)   LMP 06/22/2023 (Exact Date)   SpO2 96%   BMI 20.98 kg/m   Visual Acuity Right Eye Distance:   Left Eye Distance:   Bilateral Distance:    Right Eye Near:   Left Eye Near:    Bilateral Near:     Physical Exam Vitals reviewed.  Constitutional:      General: She is awake. She is not in acute distress.    Appearance: Normal appearance. She is well-developed and well-groomed. She is not ill-appearing or toxic-appearing.  HENT:     Head: Normocephalic and atraumatic.  Cardiovascular:     Rate and Rhythm: Normal rate and regular rhythm.     Heart sounds: Normal heart sounds. No murmur heard.    No friction rub. No gallop.  Pulmonary:     Effort: Pulmonary effort is normal.     Breath sounds: Decreased air movement present. Examination of the right-lower field reveals decreased breath sounds. Examination of the left-lower field reveals decreased breath sounds. Decreased breath sounds present. No wheezing, rhonchi or rales.  Musculoskeletal:     Cervical back: Normal range of motion.  Skin:    General: Skin is warm and dry.  Neurological:     General: No focal deficit present.     Mental Status: She is alert and oriented to person, place, and time.  Psychiatric:        Mood and Affect: Mood normal.        Behavior: Behavior normal. Behavior is cooperative.        Thought Content: Thought content normal.        Judgment: Judgment normal.      UC Treatments / Results  Labs (all labs ordered are listed, but only abnormal results are displayed) Labs Reviewed - No data to display  EKG   Radiology No results found.  Procedures Procedures (including critical care time)  Medications Ordered in UC Medications  ipratropium-albuterol  (DUONEB) 0.5-2.5 (3) MG/3ML nebulizer solution 3 mL (3 mLs Nebulization Given 07/11/23 1048)    Initial Impression /  Assessment and Plan /  UC Course  I have reviewed the triage vital signs and the nursing notes.  Pertinent labs & imaging results that were available during my care of the patient were reviewed by me and considered in my medical decision making (see chart for details).      Final Clinical Impressions(s) / UC Diagnoses   Final diagnoses:  SOB (shortness of breath)  Chest tightness  Mild intermittent asthma with acute exacerbation   Patient presents today with concerns of shortness of breath, chest tightness this been ongoing for about 3 weeks.  She reports that she has had similar symptoms in the past and has taken medications for this such as inhalers but does not have them right now.  Chart review demonstrates previous history of acute intermittent asthma.  Physical exam does demonstrate some mild decreased air movement and decreased breath sounds particularly in the lower lobes.  Vitals are overall reassuring and I do not hear any wheezes, rales, rhonchi.  DuoNeb treatment administered here in clinic.  On reexamination, lung sounds had improved significantly and there was increased airflow throughout.  At this time suspect intermittent asthma exacerbation.  Will send in Flovent inhaler to be used twice per day as well as rescue inhaler for assistance with acute exacerbations.  Reviewed reducing home triggers and common allergens.  ED return precautions reviewed and provided after visit summary.  Follow-up as needed    Discharge Instructions      You were seen today for concerns of shortness of breath and chest.  At this time I suspect you are likely having an asthma flare. This typically will involve symptoms similar to what you are experiencing such as chest tightness, difficulty breathing, coughing.  This is usually relieved with inhalers and other medications to help open up your airways and allow you to breathe a little bit easier.  I have sent in a medication called Flovent.  This is an inhaler that I want  you to take 2 puffs in the morning and 2 puffs in the evening every day for at least 2 weeks to help calm down your symptoms.  While you are taking this you can also use a rescue inhaler that I sent in.  You can use your rescue inhaler up to every 6 hours as needed for symptoms.  If you do not need to take it please do not do so unless you are having trouble breathing, coughing or chest tightness. Please make sure that you are reducing allergens in your home dusting and cleaning your linens regularly.  Please also reduce other triggers such as pet dander, pollen or dust as these can cause difficulty breathing as well. If you feel like your symptoms are getting worse or not improving you can always return here or follow-up with your PCP.  For more severe symptoms as severe difficulty breathing, passing out, severe chest pain, severe coughing please go to the emergency room as these could be signs of a medical emergency.     ED Prescriptions     Medication Sig Dispense Auth. Provider   fluticasone  (FLOVENT HFA) 110 MCG/ACT inhaler Inhale 2 puffs into the lungs in the morning and at bedtime. 12 g Jalysa Swopes E, PA-C   albuterol  (VENTOLIN  HFA) 108 (90 Base) MCG/ACT inhaler Inhale 1-2 puffs into the lungs every 6 (six) hours as needed for wheezing or shortness of breath. 8 g Patty Leitzke E, PA-C      PDMP not reviewed this encounter.  Cyndel Griffey, Pearla Bottom, PA-C 07/11/23 1155

## 2023-07-14 ENCOUNTER — Emergency Department (HOSPITAL_COMMUNITY)
Admission: EM | Admit: 2023-07-14 | Discharge: 2023-07-14 | Disposition: A | Discharge status: Home / Self Care | Attending: Emergency Medicine | Admitting: Emergency Medicine

## 2023-07-14 ENCOUNTER — Emergency Department (HOSPITAL_COMMUNITY)

## 2023-07-14 DIAGNOSIS — Z7952 Long term (current) use of systemic steroids: Secondary | ICD-10-CM | POA: Diagnosis not present

## 2023-07-14 DIAGNOSIS — Z7951 Long term (current) use of inhaled steroids: Secondary | ICD-10-CM | POA: Insufficient documentation

## 2023-07-14 DIAGNOSIS — J4521 Mild intermittent asthma with (acute) exacerbation: Secondary | ICD-10-CM | POA: Diagnosis not present

## 2023-07-14 DIAGNOSIS — R0602 Shortness of breath: Secondary | ICD-10-CM | POA: Diagnosis present

## 2023-07-14 DIAGNOSIS — J45901 Unspecified asthma with (acute) exacerbation: Secondary | ICD-10-CM

## 2023-07-14 MED ORDER — IPRATROPIUM-ALBUTEROL 0.5-2.5 (3) MG/3ML IN SOLN
3.0000 mL | Freq: Once | RESPIRATORY_TRACT | Status: AC
  Administered 2023-07-14: 3 mL via RESPIRATORY_TRACT
  Filled 2023-07-14: qty 3

## 2023-07-14 MED ORDER — PREDNISONE 50 MG PO TABS: ORAL_TABLET | ORAL | 0 refills | Status: DC

## 2023-07-14 MED ORDER — PREDNISONE 50 MG PO TABS
ORAL_TABLET | ORAL | 0 refills | Status: DC
Start: 2023-07-14 — End: 2024-01-07

## 2023-07-14 MED ORDER — PREDNISONE 20 MG PO TABS
60.0000 mg | ORAL_TABLET | Freq: Every day | ORAL | Status: DC
  Filled 2023-07-14: qty 3

## 2023-07-14 MED ORDER — PREDNISONE 20 MG PO TABS
60.0000 mg | ORAL_TABLET | Freq: Once | ORAL | Status: AC
  Administered 2023-07-14: 60 mg via ORAL

## 2023-07-14 MED ORDER — METHOCARBAMOL 500 MG PO TABS: 500.0000 mg | ORAL_TABLET | Freq: Four times a day (QID) | ORAL | 0 refills | Status: DC

## 2023-07-14 MED ORDER — METHOCARBAMOL 500 MG PO TABS
500.0000 mg | ORAL_TABLET | Freq: Once | ORAL | Status: AC
  Administered 2023-07-14: 500 mg via ORAL
  Filled 2023-07-14: qty 1

## 2023-07-14 NOTE — ED Provider Notes (Signed)
 [place] EMERGENCY DEPARTMENT AT [facility] Provider Note   CSN: [id number] Arrival date & time: 07/14/23  0940     History  Chief Complaint  Patient presents with   Shortness of Breath    Jessica Perry is a 43 y.o. female.  Patient complains of shortness of breath.  Patient reports that she has a history of asthma and has been on an inhaler in the past.  Patient reports she was recently seen at urgent care and given an inhaler.  Patient reports she has been using the inhaler without relief.  Patient states she continues to feel short of breath today.  Patient denies any fever she denies any chills she has not had any abdominal pain.  Patient denies any respiratory viral illness symptoms.  The history is provided by the patient and the spouse.  Shortness of Breath      Home Medications Prior to Admission medications   Medication Sig Start Date End Date Taking? Authorizing Provider  albuterol  (VENTOLIN  HFA) 108 (90 Base) MCG/ACT inhaler Inhale 1-2 puffs into the lungs every 6 (six) hours as needed for wheezing or shortness of breath. 07/11/23   Mecum, Erin E, PA-C  fluticasone  (FLOVENT HFA) 110 MCG/ACT inhaler Inhale 2 puffs into the lungs in the morning and at bedtime. 07/11/23   Mecum, Erin E, PA-C  meloxicam  (MOBIC ) 15 MG tablet Take 1 tablet (15 mg total) by mouth daily as needed for pain (neck pain). 12/11/22   Catheryn Cluck, MD  methocarbamol (ROBAXIN) 500 MG tablet Take 1 tablet (500 mg total) by mouth 4 (four) times daily. 07/14/23   Arvilla Birmingham, MD  predniSONE  (DELTASONE ) 50 MG tablet One tablet a day for 5 days 07/14/23   Trifan, Janalyn Me, MD      Allergies    Patient has no known allergies.    Review of Systems   Review of Systems  Respiratory:  Positive for shortness of breath.   All other systems reviewed and are negative.   Physical Exam Updated Vital Signs BP 114/69   Pulse 74   Temp 98.2 F (36.8 C)   Resp 17   LMP 06/22/2023 (Exact Date)    SpO2 100%  Physical Exam Vitals and nursing note reviewed.  Constitutional:      Appearance: She is well-developed.  HENT:     Head: Normocephalic.  Cardiovascular:     Rate and Rhythm: Normal rate and regular rhythm.  Pulmonary:     Effort: Pulmonary effort is normal.     Breath sounds: Normal breath sounds. No decreased breath sounds.  Abdominal:     General: There is no distension.  Musculoskeletal:        General: Normal range of motion.     Cervical back: Normal range of motion.  Skin:    General: Skin is warm.  Neurological:     General: No focal deficit present.     Mental Status: She is alert and oriented to person, place, and time.  Psychiatric:        Mood and Affect: Mood normal.     ED Results / Procedures / Treatments   Labs (all labs ordered are listed, but only abnormal results are displayed) Labs Reviewed - No data to display  EKG EKG Interpretation Date/Time:  Monday Jul 14 2023 09:50:14 EDT Ventricular Rate:  65 PR Interval:  145 QRS Duration:  101 QT Interval:  368 QTC Calculation: 383 R Axis:   86  Text  Interpretation: Sinus rhythm RSR' in V1 or V2, right VCD or RVH Confirmed by Jerald Molly [phone]) on 07/14/2023 10:18:04 AM  Radiology DG Chest 2 View Result Date: 07/14/2023 CLINICAL DATA:  Shortness of breath EXAM: CHEST - 2 VIEW COMPARISON:  None Available. FINDINGS: The heart size and mediastinal contours are within normal limits. Both lungs are clear. The visualized skeletal structures are unremarkable. IMPRESSION: No active cardiopulmonary disease. Electronically Signed   By: Reagan Camera M.D.   On: 07/14/2023 10:42    Procedures Procedures    Medications Ordered in ED Medications  ipratropium-albuterol  (DUONEB) 0.5-2.5 (3) MG/3ML nebulizer solution 3 mL (3 mLs Nebulization Given 07/14/23 1134)  predniSONE  (DELTASONE ) tablet 60 mg (60 mg Oral Given 07/14/23 1153)  methocarbamol (ROBAXIN) tablet 500 mg (500 mg Oral Given 07/14/23 1154)     ED Course/ Medical Decision Making/ A&P                                 Medical Decision Making Patient complains of feeling short of breath for the past 2 weeks.  Patient reports that she has soreness in her neck she has had this for over a year she has had evaluation for this  Amount and/or Complexity of Data Reviewed Independent Historian: spouse    Details: Patient is here with her husband who is supportive Radiology: ordered and independent interpretation performed. Decision-making details documented in ED Course.  Risk Prescription drug management. Risk Details: Patient is given a prescription for prednisone  and Robaxin.  Patient is advised to follow-up with pulmonology for recheck.           Final Clinical Impression(s) / ED Diagnoses Final diagnoses:  Mild asthma with exacerbation, unspecified whether persistent    Rx / DC Orders ED Discharge Orders          Ordered    predniSONE  (DELTASONE ) 50 MG tablet  Status:  Discontinued        07/14/23 1409    methocarbamol (ROBAXIN) 500 MG tablet  4 times daily,   Status:  Discontinued        07/14/23 1409    methocarbamol (ROBAXIN) 500 MG tablet  4 times daily        07/14/23 1418    predniSONE  (DELTASONE ) 50 MG tablet        07/14/23 1418           An After Visit Summary was printed and given to the patient.    Sandi Crosby, PA-C 07/14/23 1525    Arvilla Birmingham, MD 07/14/23 [phone]

## 2023-07-14 NOTE — Discharge Instructions (Addendum)
 Schedule to see Pulmonology for evaluation.  Continue using inhaler

## 2023-07-14 NOTE — ED Triage Notes (Signed)
 Pt c/o intermittent SOB for several weeks. Pt denies URI symptoms, no CP, leg swelling. Pt also c/o neck/shoulder soreness. Seen at [facility] recently for same. NAD noted during triage.

## 2023-08-20 ENCOUNTER — Encounter (HOSPITAL_BASED_OUTPATIENT_CLINIC_OR_DEPARTMENT_OTHER): Payer: Self-pay | Admitting: Nurse Practitioner

## 2023-08-20 ENCOUNTER — Other Ambulatory Visit: Payer: Self-pay | Admitting: Nurse Practitioner

## 2023-08-20 ENCOUNTER — Ambulatory Visit (HOSPITAL_BASED_OUTPATIENT_CLINIC_OR_DEPARTMENT_OTHER): Admitting: Nurse Practitioner

## 2023-08-20 VITALS — BP 116/83 | HR 71 | Ht 66.0 in | Wt 140.0 lb

## 2023-08-20 DIAGNOSIS — K219 Gastro-esophageal reflux disease without esophagitis: Secondary | ICD-10-CM

## 2023-08-20 DIAGNOSIS — J309 Allergic rhinitis, unspecified: Secondary | ICD-10-CM

## 2023-08-20 DIAGNOSIS — J4540 Moderate persistent asthma, uncomplicated: Secondary | ICD-10-CM

## 2023-08-20 MED ORDER — ALBUTEROL SULFATE HFA 108 (90 BASE) MCG/ACT IN AERS
2.0000 | INHALATION_SPRAY | Freq: Four times a day (QID) | RESPIRATORY_TRACT | 6 refills | Status: AC | PRN
Start: 2023-08-20 — End: ?

## 2023-08-20 MED ORDER — FAMOTIDINE 20 MG PO TABS: 20.0000 mg | ORAL_TABLET | Freq: Every day | ORAL | 1 refills | Status: DC | PRN

## 2023-08-20 MED ORDER — FLUTICASONE-SALMETEROL 100-50 MCG/ACT IN AEPB
1.0000 | INHALATION_SPRAY | Freq: Two times a day (BID) | RESPIRATORY_TRACT | 5 refills | Status: AC
Start: 2023-08-20 — End: ?

## 2023-08-20 NOTE — Progress Notes (Signed)
 @Patient  ID: Jessica Perry, female    DOB: 05/22/1980, 43 y.o.   MRN: [id number]  Chief Complaint  Patient presents with   Acute Visit    Shortness of breath    Referring provider: No ref. provider found  HPI: 43 year old female, never smoker followed for asthma.  She is patient Dr. Jacqualyn Mates and last seen in office 02/07/2021.  Past medical history significant for anxiety and depression, GERD  TEST/EVENTS:  2022 eos 600 07/14/2023 CXR: Lungs are clear  02/07/2021: OV with Dr. Washington Hacker for consult.  Seen in ED in November.  Worsening dyspnea at night and exertion.  ACS workup was negative.  Scheduled for pulmonary and referred to cardiology.  Symptoms occur when she overworks herself.  Has tried albuterol  up to twice a day with partial relief.  Reports difficulty taking a deep breath.  Had similar symptoms about 15 years ago and was told she had low oxygen.  Works as [place].  No sensitivities to strong odors or chemicals.  No wheezing or coughing.  Has developed allergies to avocado including throat itching.  Constellation of symptoms concerning for asthma.  Plan to treat with prednisone  40 mg for 5 days and start Advair 1 puff twice daily.  Start Singulair .  PFT ordered for further evaluation.  Offered allergy referral but she had declined.  08/20/2023: Today - hospital follow up Discussed the use of AI scribe software for clinical note transcription with the patient, who gave verbal consent to proceed.  History of Present Illness   Jessica Perry is a 43 year old female with asthma who presents with chest discomfort and difficulty breathing.  She experiences a burning sensation in her chest. This sensation is persistent and associated with a feeling of tightness in the chest and throat. Her PCP put her on reflux medications with pantoprazole, which does seem to help some.  She has a history of asthma, although there is some confusion as a previous doctor suggested it might not be asthma. Despite  this, she has been treated for asthma flares in the past. She is not currently using any inhalers. She was experiencing a cough and wheezing, and had felt her chest is tight and breathing is difficult. Went to ED 07/14/2023. She was treated with prednisone  and duoneb with improvement. CXR was clear.   She also has a history of allergies.   Interpretor present      No Known Allergies  Immunization History  Administered Date(s) Administered   PFIZER(Purple Top)SARS-COV-2 Vaccination 05/21/2019, 06/14/2019, 01/22/2020   Tdap 08/28/2021    Past Medical History:  Diagnosis Date   Asthma    Gestational diabetes     Tobacco History: Social History   Tobacco Use  Smoking Status Never  Smokeless Tobacco Never   Counseling given: Not Answered   Outpatient Medications Prior to Visit  Medication Sig Dispense Refill   pantoprazole (PROTONIX) 40 MG tablet Take 40 mg by mouth daily.     omeprazole (PRILOSEC OTC) 20 MG tablet Take 20 mg by mouth daily.     meloxicam  (MOBIC ) 15 MG tablet Take 1 tablet (15 mg total) by mouth daily as needed for pain (neck pain). (Patient not taking: Reported on 08/20/2023) 30 tablet 0   methocarbamol  (ROBAXIN ) 500 MG tablet Take 1 tablet (500 mg total) by mouth 4 (four) times daily. (Patient not taking: Reported on 08/20/2023) 20 tablet 0   predniSONE  (DELTASONE ) 50 MG tablet One tablet a day for 5 days (Patient not taking: Reported on  08/20/2023) 5 tablet 0   albuterol  (VENTOLIN  HFA) 108 (90 Base) MCG/ACT inhaler Inhale 1-2 puffs into the lungs every 6 (six) hours as needed for wheezing or shortness of breath. (Patient not taking: Reported on 08/20/2023) 8 g 1   fluticasone  (FLOVENT  HFA) 110 MCG/ACT inhaler Inhale 2 puffs into the lungs in the morning and at bedtime. (Patient not taking: Reported on 08/20/2023) 12 g 0   No facility-administered medications prior to visit.     Review of Systems:   Constitutional: No weight loss or gain, night sweats, fevers,  chills, fatigue, or lassitude. HEENT: No headaches, difficulty swallowing, tooth/dental problems, or sore throat. No sneezing, itching, ear ache, nasal congestion, or post nasal drip CV:  No chest pain, orthopnea, PND, swelling in lower extremities, anasarca, dizziness, palpitations, syncope Resp: + shortness of breath with exertion; cough; wheeze. No excess mucus or change in color of mucus.No hemoptysis. No chest wall deformity GI:  No heartburn, indigestion, abdominal pain, nausea, vomiting, diarrhea, change in bowel habits, loss of appetite, bloody stools.  GU: No dysuria, change in color of urine, urgency or frequency.  No flank pain, no hematuria  Skin: No rash, lesions, ulcerations MSK:  No joint pain or swelling.  Neuro: No dizziness or lightheadedness.  Psych: No depression or anxiety. Mood stable.     Physical Exam:  BP 116/83   Pulse 71   Ht 5' 6 (1.676 m)   Wt 140 lb (63.5 kg)   SpO2 99%   BMI 22.60 kg/m   GEN: Pleasant, interactive, well-appearing; in no acute distress HEENT:  Normocephalic and atraumatic. PERRLA. Sclera white. Nasal turbinates pink, moist and patent bilaterally. No rhinorrhea present. Oropharynx pink and moist, without exudate or edema. No lesions, ulcerations, or postnasal drip.  NECK:  Supple w/ fair ROM. No JVD present. Thyroid symmetrical with no goiter or nodules palpated. No lymphadenopathy.   CV: RRR, no m/r/g, no peripheral edema. Pulses intact, +2 bilaterally. No cyanosis, pallor or clubbing. PULMONARY:  Unlabored, regular breathing. Clear bilaterally A&P w/o wheezes/rales/rhonchi. No accessory muscle use.  GI: BS present and normoactive. Soft, non-tender to palpation. No organomegaly or masses detected.  MSK: No erythema, warmth or tenderness. Cap refil <2 sec all extrem. No deformities or joint swelling noted.  Neuro: A/Ox3. No focal deficits noted.   Skin: Warm, no lesions or rashe Psych: Normal affect and behavior. Judgement and thought  content appropriate.     Lab Results:  CBC    Component Value Date/Time   WBC 10.3 11/25/2022 1033   RBC 4.30 11/25/2022 1033   HGB 13.0 11/25/2022 1033   HCT 39.1 11/25/2022 1033   PLT 286.0 11/25/2022 1033   MCV 91.1 11/25/2022 1033   MCH 30.6 01/29/2021 1248   MCHC 33.1 11/25/2022 1033   RDW 12.8 11/25/2022 1033   LYMPHSABS 2.6 11/25/2022 1033   MONOABS 0.7 11/25/2022 1033   EOSABS 0.3 11/25/2022 1033   BASOSABS 0.0 11/25/2022 1033    BMET    Component Value Date/Time   NA 135 11/25/2022 1033   K 4.2 11/25/2022 1033   CL 103 11/25/2022 1033   CO2 24 11/25/2022 1033   GLUCOSE 92 11/25/2022 1033   BUN 11 11/25/2022 1033   CREATININE 0.57 11/25/2022 1033   CALCIUM 8.6 11/25/2022 1033   GFRNONAA >60 01/29/2021 1248   GFRAA >90 05/11/2011 2232    BNP    Component Value Date/Time   BNP 14.5 01/29/2021 1248     Imaging:  No results  found.  Administration History     None           No data to display          No results found for: NITRICOXIDE      Assessment & Plan:   Asthma, moderate persistent, poorly-controlled Constellation of symptoms have been consistent with asthma.  She also has a significant allergy history and peripheral eosinophils have been is elevated to 600 in the past.  Will recheck today as well as an environmental allergen panel.  Advised that she needs to resume therapy with Advair -Rx sent today.  Side effect profile reviewed.  Teach back performed.  Educated that this is a maintenance medication that she will use twice daily regardless of her symptoms.  Provided with albuterol  rescue inhaler and educated on proper use of this.  Does not appear to be in acute exacerbation today.  Action plan in place.  Trigger prevention reviewed. Possible that GERD is exacerbating symptoms.  Advised to increase pantoprazole to twice daily for 6 weeks and add on famotidine  for any breakthrough symptoms.  Will reassess at follow-up. GERD  precautions reviewed.  Patient Instructions  -Start Advair 1 puff Twice daily. Brush tongue and rinse mouth afterwards -Continue Albuterol  inhaler 2 puffs or 3 mL neb every 6 hours as needed for shortness of breath or wheezing. Notify if symptoms persist despite rescue inhaler/neb use. -Increase pantoprazole 40 mg to twice daily, 30 minutes before meals for the next 6 weeks then return to taking it once a day before breakfast -Start famotidine  20 mg as needed for breakthrough reflux/heartburn symptoms  Labs today to look for allergy triggers  Follow up in 6-8 weeks with Dr. Washington Hacker or Katie Remona Boom,NP. If symptoms do not improve or worsen, please contact office for sooner follow up or seek emergency care.    -B?t ??u Advair 1 nht x?t Hai l?n m?i ngy. Ch?i l??i v [place] mi?ng sau ? -Ti?p t?c dng bnh x?t Albuterol  2 nht x?t ho?c 3 mL ?ng nh? gi?t m?i 6 gi? n?u c?n ?? ?i?u tr? tnh tr?ng kh th? ho?c th? kh kh. Thng bo n?u cc tri?u ch?ng v?n ti?p di?n m?c d ? s? d?ng bnh x?t/?ng nh? gi?t c?u h?. -T?ng li?u pantoprazole 40 mg ln 2 l?n m?i ngy, 30 pht tr??c b?a ?n trong 6 tu?n ti?p theo, sau ? ti?p t?c dng m?t l?n m?i ngy tr??c b?a sng -B?t ??u dng famotidine  20 mg n?u c?n ?? ?i?u tr? cc tri?u ch?ng tro ng??c/? nng ??t ng?t  Xt nghi?m ngay hm nay ?? tm tc nhn gy d? ?ng  Theo di sau 6-8 tu?n v?i Bc s? [place] ho?c Katie Sherissa Tenenbaum, NP. N?u cc tri?u ch?ng khng c?i thi?n ho?c tr? nn t?i t? h?n, vui lng lin h? v?i phng khm ?? theo di s?m h?n ho?c tm ki?m s? ch?m [place] c?p c?u.    GERD (gastroesophageal reflux disease) See above   Advised if symptoms do not improve or worsen, to please contact office for sooner follow up or seek emergency care.   I spent 35 minutes of dedicated to the care of this patient on the date of this encounter to include pre-visit review of records, face-to-face time with the patient discussing conditions above, post visit ordering of testing,  clinical documentation with the electronic health record, making appropriate referrals as documented, and communicating necessary findings to members of the patients care team.  Roetta Clarke, NP 08/20/2023  Pt aware and  understands NP's role.

## 2023-08-20 NOTE — Patient Instructions (Addendum)
-  Start Advair 1 puff Twice daily. Brush tongue and rinse mouth afterwards -Continue Albuterol  inhaler 2 puffs or 3 mL neb every 6 hours as needed for shortness of breath or wheezing. Notify if symptoms persist despite rescue inhaler/neb use. -Increase pantoprazole 40 mg to twice daily, 30 minutes before meals for the next 6 weeks then return to taking it once a day before breakfast -Start famotidine  20 mg as needed for breakthrough reflux/heartburn symptoms  Labs today to look for allergy triggers  Follow up in 6-8 weeks with Dr. Washington Hacker or Jessica Neaveh Belanger,NP. If symptoms do not improve or worsen, please contact office for sooner follow up or seek emergency care.    -B?t ??u Advair 1 nht x?t Hai l?n m?i ngy. Ch?i l??i v [place] mi?ng sau ? -Ti?p t?c dng bnh x?t Albuterol  2 nht x?t ho?c 3 mL ?ng nh? gi?t m?i 6 gi? n?u c?n ?? ?i?u tr? tnh tr?ng kh th? ho?c th? kh kh. Thng bo n?u cc tri?u ch?ng v?n ti?p di?n m?c d ? s? d?ng bnh x?t/?ng nh? gi?t c?u h?. -T?ng li?u pantoprazole 40 mg ln 2 l?n m?i ngy, 30 pht tr??c b?a ?n trong 6 tu?n ti?p theo, sau ? ti?p t?c dng m?t l?n m?i ngy tr??c b?a sng -B?t ??u dng famotidine  20 mg n?u c?n ?? ?i?u tr? cc tri?u ch?ng tro ng??c/? nng ??t ng?t  Xt nghi?m ngay hm nay ?? tm tc nhn gy d? ?ng  Theo di sau 6-8 tu?n v?i Bc s? Jessica Perry ho?c Jessica Angelika Jerrett, NP. N?u cc tri?u ch?ng khng c?i thi?n ho?c tr? nn t?i t? h?n, vui lng lin h? v?i phng khm ?? theo di s?m h?n ho?c tm ki?m s? ch?m [place] c?p c?u.

## 2023-08-20 NOTE — Assessment & Plan Note (Signed)
 See above

## 2023-08-20 NOTE — Assessment & Plan Note (Signed)
 Constellation of symptoms have been consistent with asthma.  She also has a significant allergy history and peripheral eosinophils have been is elevated to 600 in the past.  Will recheck today as well as an environmental allergen panel.  Advised that she needs to resume therapy with Advair -Rx sent today.  Side effect profile reviewed.  Teach back performed.  Educated that this is a maintenance medication that she will use twice daily regardless of her symptoms.  Provided with albuterol  rescue inhaler and educated on proper use of this.  Does not appear to be in acute exacerbation today.  Action plan in place.  Trigger prevention reviewed. Possible that GERD is exacerbating symptoms.  Advised to increase pantoprazole to twice daily for 6 weeks and add on famotidine  for any breakthrough symptoms.  Will reassess at follow-up. GERD precautions reviewed.  Patient Instructions  -Start Advair 1 puff Twice daily. Brush tongue and rinse mouth afterwards -Continue Albuterol  inhaler 2 puffs or 3 mL neb every 6 hours as needed for shortness of breath or wheezing. Notify if symptoms persist despite rescue inhaler/neb use. -Increase pantoprazole 40 mg to twice daily, 30 minutes before meals for the next 6 weeks then return to taking it once a day before breakfast -Start famotidine  20 mg as needed for breakthrough reflux/heartburn symptoms  Labs today to look for allergy triggers  Follow up in 6-8 weeks with Dr. Washington Hacker or Katie Tracen Mahler,NP. If symptoms do not improve or worsen, please contact office for sooner follow up or seek emergency care.    -B?t ??u Advair 1 nht x?t Hai l?n m?i ngy. Ch?i l??i v [place] mi?ng sau ? -Ti?p t?c dng bnh x?t Albuterol  2 nht x?t ho?c 3 mL ?ng nh? gi?t m?i 6 gi? n?u c?n ?? ?i?u tr? tnh tr?ng kh th? ho?c th? kh kh. Thng bo n?u cc tri?u ch?ng v?n ti?p di?n m?c d ? s? d?ng bnh x?t/?ng nh? gi?t c?u h?. -T?ng li?u pantoprazole 40 mg ln 2 l?n m?i ngy, 30 pht tr??c b?a ?n  trong 6 tu?n ti?p theo, sau ? ti?p t?c dng m?t l?n m?i ngy tr??c b?a sng -B?t ??u dng famotidine  20 mg n?u c?n ?? ?i?u tr? cc tri?u ch?ng tro ng??c/? nng ??t ng?t  Xt nghi?m ngay hm nay ?? tm tc nhn gy d? ?ng  Theo di sau 6-8 tu?n v?i Bc s? [place] ho?c Katie Lavenia Stumpo, NP. N?u cc tri?u ch?ng khng c?i thi?n ho?c tr? nn t?i t? h?n, vui lng lin h? v?i phng khm ?? theo di s?m h?n ho?c tm ki?m s? ch?m [place] c?p c?u.

## 2023-08-21 LAB — CBC WITH DIFFERENTIAL/PLATELET
Basophils Absolute: 0.1 10*3/uL (ref 0.0–0.2)
Basos: 1 %
EOS (ABSOLUTE): 0.4 10*3/uL (ref 0.0–0.4)
Eos: 5 %
Hematocrit: 41.3 % (ref 34.0–46.6)
Hemoglobin: 13.6 g/dL (ref 11.1–15.9)
Immature Grans (Abs): 0 10*3/uL (ref 0.0–0.1)
Immature Granulocytes: 0 %
Lymphocytes Absolute: 2.7 10*3/uL (ref 0.7–3.1)
Lymphs: 39 %
MCH: 31.4 pg (ref 26.6–33.0)
MCHC: 32.9 g/dL (ref 31.5–35.7)
MCV: 95 fL (ref 79–97)
Monocytes Absolute: 0.5 10*3/uL (ref 0.1–0.9)
Monocytes: 8 %
Neutrophils Absolute: 3.2 10*3/uL (ref 1.4–7.0)
Neutrophils: 47 %
Platelets: 269 10*3/uL (ref 150–450)
RBC: 4.33 x10E6/uL (ref 3.77–5.28)
RDW: 12.1 % (ref 11.7–15.4)
WBC: 6.8 10*3/uL (ref 3.4–10.8)

## 2023-08-27 LAB — ALLERGEN PANEL (27) + IGE
Alternaria Alternata IgE: <0.1 kU/L
Aspergillus Fumigatus IgE: <0.1 kU/L
Bahia Grass IgE: 0.28 kU/L — AB
Bermuda Grass IgE: <0.1 kU/L
Cat Dander IgE: <0.1 kU/L
Cedar, Mountain IgE: <0.1 kU/L
Cladosporium Herbarum IgE: <0.1 kU/L
Cocklebur IgE: 0.3 kU/L — AB
Cockroach, American IgE: <0.1 kU/L
Common Silver Birch IgE: 3.41 kU/L — AB
D Farinae IgE: <0.1 kU/L
D Pteronyssinus IgE: <0.1 kU/L
Dog Dander IgE: <0.1 kU/L
Elm, American IgE: 2.36 kU/L — AB
Hickory, White IgE: 0.28 kU/L — AB
IgE (Immunoglobulin E), Serum: 84 [IU]/mL (ref 6–495)
Johnson Grass IgE: 0.12 kU/L — AB
Kentucky Bluegrass IgE: 0.38 kU/L — AB
Maple/Box Elder IgE: 0.17 kU/L — AB
Mucor Racemosus IgE: <0.1 kU/L
Oak, White IgE: 6.52 kU/L — AB
Penicillium Chrysogen IgE: <0.1 kU/L
Pigweed, Rough IgE: 0.27 kU/L — AB
Plantain, English IgE: <0.1 kU/L
Ragweed, Short IgE: 0.74 kU/L — AB
Setomelanomma Rostrat: <0.1 kU/L
Timothy Grass IgE: <0.1 kU/L
White Mulberry IgE: <0.1 kU/L

## 2023-08-29 ENCOUNTER — Ambulatory Visit: Payer: Self-pay | Admitting: Nurse Practitioner

## 2023-08-29 NOTE — Progress Notes (Signed)
 Eosinophils 400 and positive allergen panel to grasses, trees, ragweed, cocklebur, pigweed. Needs to ensure she is taking an OTC non drowsy allergy pill (zyrtec, claritin, xyzal). Continue inhaler as prescribed. If she continues to have difficulties, will send her to allergy. Thanks.

## 2023-09-01 NOTE — Progress Notes (Signed)
Left pt message to return call 

## 2023-09-09 NOTE — Progress Notes (Signed)
Left pt message to return call 

## 2023-10-16 ENCOUNTER — Ambulatory Visit (HOSPITAL_BASED_OUTPATIENT_CLINIC_OR_DEPARTMENT_OTHER): Admitting: Nurse Practitioner

## 2023-10-16 ENCOUNTER — Encounter (HOSPITAL_BASED_OUTPATIENT_CLINIC_OR_DEPARTMENT_OTHER): Payer: Self-pay | Admitting: Nurse Practitioner

## 2023-10-16 VITALS — BP 112/78 | HR 69 | Ht 66.0 in | Wt 134.8 lb

## 2023-10-16 DIAGNOSIS — K219 Gastro-esophageal reflux disease without esophagitis: Secondary | ICD-10-CM

## 2023-10-16 DIAGNOSIS — Z9109 Other allergy status, other than to drugs and biological substances: Secondary | ICD-10-CM | POA: Insufficient documentation

## 2023-10-16 DIAGNOSIS — T7840XA Allergy, unspecified, initial encounter: Secondary | ICD-10-CM | POA: Insufficient documentation

## 2023-10-16 DIAGNOSIS — M542 Cervicalgia: Secondary | ICD-10-CM

## 2023-10-16 DIAGNOSIS — G8929 Other chronic pain: Secondary | ICD-10-CM

## 2023-10-16 DIAGNOSIS — J3089 Other allergic rhinitis: Secondary | ICD-10-CM | POA: Diagnosis not present

## 2023-10-16 DIAGNOSIS — J4540 Moderate persistent asthma, uncomplicated: Secondary | ICD-10-CM | POA: Diagnosis not present

## 2023-10-16 MED ORDER — LEVOCETIRIZINE DIHYDROCHLORIDE 5 MG PO TABS: 5.0000 mg | ORAL_TABLET | Freq: Every evening | ORAL | 5 refills | Status: AC

## 2023-10-16 NOTE — Assessment & Plan Note (Signed)
 Positive RAST to grasses, trees, weeds. Add on Xyzal  - rx sent. Side effect profile reviewed. See above

## 2023-10-16 NOTE — Progress Notes (Signed)
 @Patient  ID: Jessica Perry, female    DOB: 12/29/1980, 43 y.o.   MRN: [id number]  Chief Complaint  Patient presents with   Follow-up    Asthma    Referring provider: No ref. provider found  HPI: 43 year old female, never smoker followed for asthma.  She is patient Dr. Laymond and last seen in office 08/20/2023 by Jessica Perry.  Past medical history significant for anxiety and depression, GERD  TEST/EVENTS:  2022 eos 600 07/14/2023 CXR: Lungs are clear 08/20/2023 allergen panel positive to grasses, trees, ragweed, cocklebur, pigweed   02/07/2021: OV with Dr. Kassie for consult.  Seen in ED in November.  Worsening dyspnea at night and exertion.  ACS workup was negative.  Scheduled for pulmonary and referred to cardiology.  Symptoms occur when she overworks herself.  Has tried albuterol  up to twice a day with partial relief.  Reports difficulty taking a deep breath.  Had similar symptoms about 15 years ago and was told she had low oxygen.  Works as [place].  No sensitivities to strong odors or chemicals.  No wheezing or coughing.  Has developed allergies to avocado including throat itching.  Constellation of symptoms concerning for asthma.  Plan to treat with prednisone  40 mg for 5 days and start Advair 1 puff twice daily.  Start Singulair .  PFT ordered for further evaluation.  Offered allergy referral but she had declined.  08/20/2023: Jessica Perry with Jessica Edelen Perry Discussed the use of AI scribe software for clinical note transcription with the patient, who gave verbal consent to proceed. Jessica Perry is a 43 year old female with asthma who presents with chest discomfort and difficulty breathing. She experiences a burning sensation in her chest. This sensation is persistent and associated with a feeling of tightness in the chest and throat. Her PCP put her on reflux medications with pantoprazole, which does seem to help some. She has a history of asthma, although there is some confusion as a previous doctor suggested it  might not be asthma. Despite this, she has been treated for asthma flares in the past. She is not currently using any inhalers. She was experiencing a cough and wheezing, and had felt her chest is tight and breathing is difficult. Went to ED 07/14/2023. She was treated with prednisone  and duoneb with improvement. CXR was clear.  She also has a history of allergies.  Interpretor present    10/16/2023: Today - follow up Discussed the use of AI scribe software for clinical note transcription with the patient, who gave verbal consent to proceed.  History of Present Illness A 43 year old female with asthma and acid reflux who presents for follow up. Interpretor is with her.  She has discontinued her inhaler use as she perceives her breathing to be better now. Despite this, she has had multiple emergency room visits due to shortness of breath and chest pain, described as pain when taking a deep breath and chest tightness. She has been prescribed Advair, one puff twice daily, but has not adhered consistently to this regimen. No current wheezing, cough, chest tightness, SOB.   She experiences symptoms of persistent acid reflux, including a sensation of food being stuck in her throat. She was prescribed pantoprazole twice daily but has been taking it only once daily due to a misunderstanding of the prescription instructions. She also was supposed to add famotidine  for breakthrough symptoms, but did not do so. No voice hoarseness, abdominal pain. Hasn't seen GI per her report.   She reports neck  pain that has previously been evaluated without any significant findings. She has previously consulted an orthopedist and had a normal X-ray. She has also seen a chiropractor for two years without relief. 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 No numbness/tingling, weakness, headaches.     No Known Allergies  Immunization History  Administered Date(s) Administered    PFIZER(Purple Top)SARS-COV-2 Vaccination 05/21/2019, 06/14/2019, 01/22/2020   Tdap 08/28/2021    Past Medical History:  Diagnosis Date   Asthma    Gestational diabetes     Tobacco History: Social History   Tobacco Use  Smoking Status Never  Smokeless Tobacco Never   Counseling given: Not Answered   Outpatient Medications Prior to Visit  Medication Sig Dispense Refill   albuterol  (VENTOLIN  HFA) 108 (90 Base) MCG/ACT inhaler Inhale 2 puffs into the lungs every 6 (six) hours as needed for wheezing or shortness of breath. 8 g 6   famotidine  (PEPCID ) 20 MG tablet Take 1 tablet (20 mg total) by mouth daily as needed for heartburn or indigestion. 30 tablet 1   fluticasone -salmeterol (ADVAIR) 100-50 MCG/ACT AEPB Inhale 1 puff into the lungs 2 (two) times daily. 60 each 5   meloxicam  (MOBIC ) 15 MG tablet Take 1 tablet (15 mg total) by mouth daily as needed for pain (neck pain). (Patient not taking: Reported on 08/20/2023) 30 tablet 0   methocarbamol  (ROBAXIN ) 500 MG tablet Take 1 tablet (500 mg total) by mouth 4 (four) times daily. (Patient not taking: Reported on 08/20/2023) 20 tablet 0   pantoprazole (PROTONIX) 40 MG tablet Take 40 mg by mouth daily.     predniSONE  (DELTASONE ) 50 MG tablet One tablet a day for 5 days (Patient not taking: Reported on 08/20/2023) 5 tablet 0   No facility-administered medications prior to visit.     Review of Systems:   Constitutional: No weight loss or gain, night sweats, fevers, chills, fatigue, or lassitude. HEENT: No headaches, difficulty swallowing, tooth/dental problems, or sore throat. No sneezing, itching, ear ache, nasal congestion, or post nasal drip CV:  No chest pain, orthopnea, PND, swelling in lower extremities, anasarca, dizziness, palpitations, syncope Resp: No shortness of breath with exertion; cough; wheeze. No excess mucus or change in color of mucus.No hemoptysis. No chest wall deformity GI:  + heartburn, indigestion, abdominal pain,  nausea, vomiting, diarrhea, change in bowel habits, loss of appetite Skin: No rash, lesions, ulcerations MSK:  No joint pain or swelling. +chronic neck pain  Neuro: No dizziness or lightheadedness.  Psych: No depression or anxiety. Mood stable.     Physical Exam:  BP 112/78   Pulse 69   Ht 5' 6 (1.676 m)   Wt 134 lb 12.8 oz (61.1 kg)   SpO2 97%   BMI 21.76 kg/m   GEN: Pleasant, interactive, well-appearing; in no acute distress HEENT:  Normocephalic and atraumatic. PERRLA. Sclera white. Nasal turbinates pink, moist and patent bilaterally. No rhinorrhea present. Oropharynx pink and moist, without exudate or edema. No lesions, ulcerations, or postnasal drip.  NECK:  Supple w/ fair ROM. No JVD present. Thyroid symmetrical with no goiter or nodules palpated. No lymphadenopathy.   CV: RRR, no m/r/g, no peripheral edema. Pulses intact, +2 bilaterally. No cyanosis, pallor or clubbing. PULMONARY:  Unlabored, regular breathing. Clear bilaterally A&P w/o wheezes/rales/rhonchi. No accessory muscle use.  GI: BS present and normoactive. Soft, non-tender to palpation. MSK: No erythema, warmth or tenderness. Cap refil <2 sec all extrem. No deformities or joint swelling noted.  Neuro: A/Ox3. No focal deficits noted.   Skin: Warm, no lesions or rashe Psych: Normal affect and behavior. Judgement and thought content appropriate.     Lab Results:  CBC    Component Value Date/Time   WBC 6.8 08/20/2023 1113   WBC 10.3 11/25/2022 1033   RBC 4.33 08/20/2023 1113   RBC 4.30 11/25/2022 1033   HGB 13.6 08/20/2023 1113   HCT 41.3 08/20/2023 1113   PLT 269 08/20/2023 1113   MCV 95 08/20/2023 1113   MCH 31.4 08/20/2023 1113   MCH 30.6 01/29/2021 1248   MCHC 32.9 08/20/2023 1113   MCHC 33.1 11/25/2022 1033   RDW 12.1 08/20/2023 1113   LYMPHSABS 2.7 08/20/2023 1113   MONOABS 0.7 11/25/2022 1033   EOSABS 0.4 08/20/2023 1113   BASOSABS 0.1 08/20/2023 1113    BMET    Component Value Date/Time    NA 135 11/25/2022 1033   K 4.2 11/25/2022 1033   CL 103 11/25/2022 1033   CO2 24 11/25/2022 1033   GLUCOSE 92 11/25/2022 1033   BUN 11 11/25/2022 1033   CREATININE 0.57 11/25/2022 1033   CALCIUM 8.6 11/25/2022 1033   GFRNONAA >60 01/29/2021 1248   GFRAA >90 05/11/2011 2232    BNP    Component Value Date/Time   BNP 14.5 01/29/2021 1248     Imaging:  No results found.  Administration History     None           No data to display          No results found for: NITRICOXIDE      Assessment & Plan:   Asthma, moderate persistent, poorly-controlled Constellation of symptoms have been consistent with asthma.  She also has a significant allergy history and peripheral eosinophils have been is elevated to 600 in the past.  Positive RAST panel, consistent with allergic phenotype. She has poor adherence to therapy and tends to have recurrent exacerbations with discontinuation.  Advised that she needs to resume therapy with Advair. Verbalized understanding.  Side effect profile reviewed.  Educated that this is a maintenance medication that she will use twice daily regardless of her symptoms. Does not appear to be in acute exacerbation today.  Action plan in place.  Trigger prevention reviewed. Possible that GERD is exacerbating symptoms.  Advised at prior OV to increase pantoprazole to twice daily for 6 weeks and add on famotidine  for any breakthrough symptoms.  She has not done this; still experiencing GERD symptoms. Advised to trial this and referred to GI. GERD precautions reviewed.  Patient Instructions  -Restart Advair 1 puff Twice daily. Brush tongue and rinse mouth afterwards. You NEED to stay on this every day, twice a day, regardless of how you are feeling -Continue Albuterol  inhaler 2 puffs or 3 mL neb every 6 hours as needed for shortness of breath or wheezing. Notify if symptoms persist despite rescue inhaler/neb use. -Increase pantoprazole 40 mg to twice daily,  30 minutes before meals for the next 6 weeks then return to taking it once a day before breakfast -Start famotidine  20 mg as needed for breakthrough reflux/heartburn symptoms -Start Xyzal  1 tab daily for allergies    You have significant allergy symptoms and findings consistent with asthma, which is why it's important to use your inhalers as prescribed   Referral to GI  Referral to sports medicine    Follow up in 3 months with Dr. Kassie or Katie Farooq Petrovich,Perry. If symptoms do not improve or worsen, please contact  office for sooner follow up or seek emergency care.  - Kh?i ??ng l?i Advair 1 nht x?t, 2 l?n/ngy. Ch?i l??i v [place] mi?ng sau ?SABRA B?n C?N duy tr vi?c ny m?i ngy, 2 l?n/ngy, b?t k? c?m gic c?a b?n nh? th? no. - Ti?p t?c dng ?ng ht Albuterol  2 nht x?t ho?c 3 ml ?ng ht m?i 6 gi? khi c?n thi?t ?? gi?m kh th? ho?c th? kh kh. Thng bo n?u cc tri?u ch?ng v?n ti?p di?n m?c d ? s? d?ng ?ng ht/?ng ht c?p c?u. - T?ng li?u pantoprazole 40 mg ln 2 l?n/ngy, 30 pht tr??c b?a ?n trong 6 tu?n ti?p theo, sau ? ti?p t?c dng 1 l?n/ngy tr??c b?a sng. - B?t ??u dng famotidine  20 mg khi c?n thi?t ?? gi?m cc tri?u ch?ng tro ng??c/? nng ??t ng?t. - B?t ??u dng Xyzal  1 vin/ngy ?? ?i?u tr? d? ?ng.  B?n c cc tri?u ch?ng d? ?ng ?ng k? v cc tri?u ch?ng ph h?p v?i b?nh hen suy?n, ? l l do t?i sao vi?c s? d?ng ?ng ht theo ch? ??nh l r?t quan tr?ng.  Chuy?n ??n chuyn khoa Tiu ha  Chuy?n ??n chuyn khoa Y h?c Th? thao  Ti khm sau 3 thng v?i Bc s? [place] ho?c Katie Jarrah Babich, Perry. N?u cc tri?u ch?ng khng c?i thi?n ho?c tr? nn t?i t? h?n, vui lng lin h? v?i phng khm ?? ???c theo di s?m h?n ho?c tm ki?m s? ch?m [place] kh?n c?p.   GERD (gastroesophageal reflux disease) See above  Neck pain Chronic. Prior eval unremarkable. 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 No red flag symptoms. Referred to sports medicine. May benefit from PT.   Environmental  allergies Positive RAST to grasses, trees, weeds. Add on Xyzal  - rx sent. Side effect profile reviewed. See above    Advised if symptoms do not improve or worsen, to please contact office for sooner follow up or seek emergency care.   I spent 35 minutes of dedicated to the care of this patient on the date of this encounter to include pre-visit review of records, face-to-face time with the patient discussing conditions above, post visit ordering of testing, clinical documentation with the electronic health record, making appropriate referrals as documented, and communicating necessary findings to members of the patients care team.  Comer LULLA Rouleau, Perry 10/16/2023  Pt aware and understands Perry's role.

## 2023-10-16 NOTE — Patient Instructions (Addendum)
-  Restart Advair 1 puff Twice daily. Brush tongue and rinse mouth afterwards. You NEED to stay on this every day, twice a day, regardless of how you are feeling -Continue Albuterol  inhaler 2 puffs or 3 mL neb every 6 hours as needed for shortness of breath or wheezing. Notify if symptoms persist despite rescue inhaler/neb use. -Increase pantoprazole 40 mg to twice daily, 30 minutes before meals for the next 6 weeks then return to taking it once a day before breakfast -Start famotidine  20 mg as needed for breakthrough reflux/heartburn symptoms -Start Xyzal  1 tab daily for allergies    You have significant allergy symptoms and findings consistent with asthma, which is why it's important to use your inhalers as prescribed   Referral to GI  Referral to sports medicine    Follow up in 3 months with Dr. Kassie or Katie Caili Escalera,NP. If symptoms do not improve or worsen, please contact office for sooner follow up or seek emergency care.  - Kh?i ??ng l?i Advair 1 nht x?t, 2 l?n/ngy. Ch?i l??i v [place] mi?ng sau ?SABRA B?n C?N duy tr vi?c ny m?i ngy, 2 l?n/ngy, b?t k? c?m gic c?a b?n nh? th? no. - Ti?p t?c dng ?ng ht Albuterol  2 nht x?t ho?c 3 ml ?ng ht m?i 6 gi? khi c?n thi?t ?? gi?m kh th? ho?c th? kh kh. Thng bo n?u cc tri?u ch?ng v?n ti?p di?n m?c d ? s? d?ng ?ng ht/?ng ht c?p c?u. - T?ng li?u pantoprazole 40 mg ln 2 l?n/ngy, 30 pht tr??c b?a ?n trong 6 tu?n ti?p theo, sau ? ti?p t?c dng 1 l?n/ngy tr??c b?a sng. - B?t ??u dng famotidine  20 mg khi c?n thi?t ?? gi?m cc tri?u ch?ng tro ng??c/? nng ??t ng?t. - B?t ??u dng Xyzal  1 vin/ngy ?? ?i?u tr? d? ?ng.  B?n c cc tri?u ch?ng d? ?ng ?ng k? v cc tri?u ch?ng ph h?p v?i b?nh hen suy?n, ? l l do t?i sao vi?c s? d?ng ?ng ht theo ch? ??nh l r?t quan tr?ng.  Chuy?n ??n chuyn khoa Tiu ha  Chuy?n ??n chuyn khoa Y h?c Th? thao  Ti khm sau 3 thng v?i Bc s? [place] ho?c Katie Gwendola Hornaday, NP. N?u cc tri?u ch?ng khng  c?i thi?n ho?c tr? nn t?i t? h?n, vui lng lin h? v?i phng khm ?? ???c theo di s?m h?n ho?c tm ki?m s? ch?m  kh?n c?p.

## 2023-10-16 NOTE — Assessment & Plan Note (Signed)
 See above

## 2023-10-16 NOTE — Assessment & Plan Note (Signed)
 Constellation of symptoms have been consistent with asthma.  She also has a significant allergy history and peripheral eosinophils have been is elevated to 600 in the past.  Positive RAST panel, consistent with allergic phenotype. She has poor adherence to therapy and tends to have recurrent exacerbations with discontinuation.  Advised that she needs to resume therapy with Advair. Verbalized understanding.  Side effect profile reviewed.  Educated that this is a maintenance medication that she will use twice daily regardless of her symptoms. Does not appear to be in acute exacerbation today.  Action plan in place.  Trigger prevention reviewed. Possible that GERD is exacerbating symptoms.  Advised at prior OV to increase pantoprazole to twice daily for 6 weeks and add on famotidine  for any breakthrough symptoms.  She has not done this; still experiencing GERD symptoms. Advised to trial this and referred to GI. GERD precautions reviewed.  Patient Instructions  -Restart Advair 1 puff Twice daily. Brush tongue and rinse mouth afterwards. You NEED to stay on this every day, twice a day, regardless of how you are feeling -Continue Albuterol  inhaler 2 puffs or 3 mL neb every 6 hours as needed for shortness of breath or wheezing. Notify if symptoms persist despite rescue inhaler/neb use. -Increase pantoprazole 40 mg to twice daily, 30 minutes before meals for the next 6 weeks then return to taking it once a day before breakfast -Start famotidine  20 mg as needed for breakthrough reflux/heartburn symptoms -Start Xyzal  1 tab daily for allergies    You have significant allergy symptoms and findings consistent with asthma, which is why it's important to use your inhalers as prescribed   Referral to GI  Referral to sports medicine    Follow up in 3 months with Dr. Kassie or Katie Jahki Witham,NP. If symptoms do not improve or worsen, please contact office for sooner follow up or seek emergency care.  - Kh?i ??ng l?i  Advair 1 nht x?t, 2 l?n/ngy. Ch?i l??i v [place] mi?ng sau ?SABRA B?n C?N duy tr vi?c ny m?i ngy, 2 l?n/ngy, b?t k? c?m gic c?a b?n nh? th? no. - Ti?p t?c dng ?ng ht Albuterol  2 nht x?t ho?c 3 ml ?ng ht m?i 6 gi? khi c?n thi?t ?? gi?m kh th? ho?c th? kh kh. Thng bo n?u cc tri?u ch?ng v?n ti?p di?n m?c d ? s? d?ng ?ng ht/?ng ht c?p c?u. - T?ng li?u pantoprazole 40 mg ln 2 l?n/ngy, 30 pht tr??c b?a ?n trong 6 tu?n ti?p theo, sau ? ti?p t?c dng 1 l?n/ngy tr??c b?a sng. - B?t ??u dng famotidine  20 mg khi c?n thi?t ?? gi?m cc tri?u ch?ng tro ng??c/? nng ??t ng?t. - B?t ??u dng Xyzal  1 vin/ngy ?? ?i?u tr? d? ?ng.  B?n c cc tri?u ch?ng d? ?ng ?ng k? v cc tri?u ch?ng ph h?p v?i b?nh hen suy?n, ? l l do t?i sao vi?c s? d?ng ?ng ht theo ch? ??nh l r?t quan tr?ng.  Chuy?n ??n chuyn khoa Tiu ha  Chuy?n ??n chuyn khoa Y h?c Th? thao  Ti khm sau 3 thng v?i Bc s? [place] ho?c Katie Maritsa Hunsucker, NP. N?u cc tri?u ch?ng khng c?i thi?n ho?c tr? nn t?i t? h?n, vui lng lin h? v?i phng khm ?? ???c theo di s?m h?n ho?c tm ki?m s? ch?m [place] kh?n c?p.

## 2023-10-16 NOTE — Assessment & Plan Note (Signed)
 Chronic. Prior eval unremarkable. 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 No red flag symptoms. Referred to sports medicine. May benefit from PT.

## 2023-10-26 ENCOUNTER — Other Ambulatory Visit (HOSPITAL_BASED_OUTPATIENT_CLINIC_OR_DEPARTMENT_OTHER): Payer: Self-pay | Admitting: Nurse Practitioner

## 2023-10-26 DIAGNOSIS — K219 Gastro-esophageal reflux disease without esophagitis: Secondary | ICD-10-CM

## 2023-11-25 NOTE — Progress Notes (Unsigned)
   LILLETTE Ileana Collet, PhD, LAT, ATC acting as a scribe for Artist Lloyd, MD.  Jessica Perry is a 43 y.o. female who presents to [facility] at [facility] today for neck pain x ***. Pt locates pain to ***  Radiates:  UE Numbness/tingling: UE Weakness: Aggravates: Treatments tried:  Dx imaging: 02/04/23 C-spine XR  09/16/22 C-spine CT  09/14/22 C-spine XR  Pertinent review of systems: ***  Relevant historical information: ***   Exam:  There were no vitals taken for this visit. General: Well Developed, well nourished, and in no acute distress.   MSK: ***    Lab and Radiology Results No results found for this or any previous visit (from the past 72 hours). No results found.     Assessment and Plan: 43 y.o. female with ***   PDMP not reviewed this encounter. No orders of the defined types were placed in this encounter.  No orders of the defined types were placed in this encounter.    Discussed warning signs or symptoms. Please see discharge instructions. Patient expresses understanding.   ***

## 2023-11-26 ENCOUNTER — Encounter: Payer: Self-pay | Admitting: Family Medicine

## 2023-11-26 ENCOUNTER — Ambulatory Visit

## 2023-11-26 ENCOUNTER — Ambulatory Visit (INDEPENDENT_AMBULATORY_CARE_PROVIDER_SITE_OTHER): Admitting: Family Medicine

## 2023-11-26 VITALS — BP 104/74 | HR 88 | Ht 66.0 in | Wt 138.0 lb

## 2023-11-26 DIAGNOSIS — G8929 Other chronic pain: Secondary | ICD-10-CM

## 2023-11-26 DIAGNOSIS — M545 Low back pain, unspecified: Secondary | ICD-10-CM | POA: Diagnosis not present

## 2023-11-26 DIAGNOSIS — M542 Cervicalgia: Secondary | ICD-10-CM

## 2023-11-26 MED ORDER — TIZANIDINE HCL 2 MG PO TABS: 2.0000 mg | ORAL_TABLET | Freq: Every evening | ORAL | 1 refills | Status: AC | PRN

## 2023-11-26 NOTE — Patient Instructions (Addendum)
 Thank you for coming in today.   Please get an Xray today before you leave   I've sent a prescription for Tizanidine  to your pharmacy.   I've referred you to Physical Therapy.  Let us  know if you don't hear from them in one week.   Check back in 6 weeks

## 2023-12-01 ENCOUNTER — Ambulatory Visit: Payer: Self-pay | Admitting: Family Medicine

## 2023-12-01 NOTE — Progress Notes (Signed)
 Cervical spine x-ray looks normal to radiology.

## 2023-12-03 ENCOUNTER — Ambulatory Visit

## 2023-12-03 ENCOUNTER — Telehealth: Payer: Self-pay | Admitting: Family Medicine

## 2023-12-03 NOTE — Telephone Encounter (Signed)
 Patient called and where we referred for PT says they do not accept their insurance. Patient asked where they can go that will accept their insurance. Patient also asked where they could get in sooner and I informed patient that they would have to call to see where they could get in sooner and let us  know so that we can refer to an appropriate location. Please advise.

## 2023-12-05 NOTE — Telephone Encounter (Signed)
 It has been changed.

## 2023-12-05 NOTE — Telephone Encounter (Addendum)
 Bri, I think I updated the referral, forwarding encounter to you just in case I did not do it correctly.   Thanks!  Looks like this place is in [facility] [place] [address] [place], [place], [place] [address] Get directions (est. 1.7 miles away)

## 2023-12-15 ENCOUNTER — Telehealth: Payer: Self-pay | Admitting: Family Medicine

## 2023-12-15 NOTE — Telephone Encounter (Signed)
 Patient called and stated that his referral was sent and he has called the phone number [phone]. He can not get in touch with anyone. He is in a lot of pain and has waited a few weeks and has not heard back.  Please advise

## 2023-12-19 ENCOUNTER — Encounter: Payer: Self-pay | Admitting: Nurse Practitioner

## 2023-12-29 ENCOUNTER — Inpatient Hospital Stay: Admission: RE | Admit: 2023-12-29 | Payer: 59 | Source: Ambulatory Visit

## 2023-12-29 ENCOUNTER — Other Ambulatory Visit: Payer: Self-pay | Admitting: Nurse Practitioner

## 2023-12-29 DIAGNOSIS — Z1231 Encounter for screening mammogram for malignant neoplasm of breast: Secondary | ICD-10-CM

## 2024-01-06 NOTE — Progress Notes (Unsigned)
   LILLETTE Ileana Collet, PhD, LAT, ATC acting as a scribe for Artist Lloyd, MD.  Jessica Perry is a 43 y.o. female who presents to [facility] at [facility] today for f/u neck and low back pain. Pt was last seen by Dr. Lloyd on 11/26/23 and was prescribed tizanidine , and referred to PT.  Today, pt reports ***  Dx imaging: 11/26/23 C-spine XR 02/04/23 C-spine XR             09/16/22 C-spine CT             09/14/22 C-spine XR  Pertinent review of systems: ***  Relevant historical information: ***   Exam:  There were no vitals taken for this visit. General: Well Developed, well nourished, and in no acute distress.   MSK: ***    Lab and Radiology Results No results found for this or any previous visit (from the past 72 hours). No results found.     Assessment and Plan: 43 y.o. female with ***   PDMP not reviewed this encounter. No orders of the defined types were placed in this encounter.  No orders of the defined types were placed in this encounter.    Discussed warning signs or symptoms. Please see discharge instructions. Patient expresses understanding.   ***

## 2024-01-07 ENCOUNTER — Encounter: Payer: Self-pay | Admitting: Family Medicine

## 2024-01-07 ENCOUNTER — Ambulatory Visit (INDEPENDENT_AMBULATORY_CARE_PROVIDER_SITE_OTHER): Admitting: Family Medicine

## 2024-01-07 VITALS — BP 100/68 | Ht 66.0 in | Wt 133.0 lb

## 2024-01-07 DIAGNOSIS — M542 Cervicalgia: Secondary | ICD-10-CM

## 2024-01-07 MED ORDER — METHOCARBAMOL 500 MG PO TABS: 500.0000 mg | ORAL_TABLET | Freq: Three times a day (TID) | ORAL | 0 refills | Status: AC | PRN

## 2024-01-07 NOTE — Patient Instructions (Addendum)
 Thank you for coming in today.   Physical therapy 01/12/24 at 1pm Address: [facility] [employer] [facility], [address], [place], [place] [address] Phone: [phone] Call today to double check   Try a heating pad for the neck and shoulders.   Recheck after PT ends if not better.

## 2024-01-12 ENCOUNTER — Encounter: Payer: Self-pay | Admitting: Radiology

## 2024-01-19 ENCOUNTER — Ambulatory Visit: Admitting: Nurse Practitioner

## 2024-01-20 ENCOUNTER — Ambulatory Visit

## 2024-01-29 ENCOUNTER — Ambulatory Visit: Admitting: Family Medicine

## 2024-02-09 ENCOUNTER — Telehealth: Payer: Self-pay | Admitting: Family Medicine

## 2024-02-09 NOTE — Telephone Encounter (Signed)
 Patients daughter called to make sure that we call back on the phone number [phone] and not the other one which is her husbands number in regards to this message.

## 2024-02-09 NOTE — Telephone Encounter (Signed)
 Spoke to pt's daughter to troubleshoot. Pt went to PT and was provided HEP that she's been working on w/o any improvement. Daughter reported that pt is scheduled for cervical MRI on Wednesday at [facility], but was told upon scheduling that the MRI was not going to be covered under the insurance. Since [facility] is in a different system, I was not able to answer questions about coverage etc. Advised daughter to call whoever ordered the MRI and inquire if it has been authorized by the insurance and/or call the imaging center. Advised that we would be happy to go ahead and order the MRI, but that the [facility] MRI would need to be canceled in order for us  to be likely to get a new order auth. Daughter verbalized understanding and will call us  back if needed.

## 2024-02-09 NOTE — Telephone Encounter (Signed)
 Patient called stating that she is scheduled for an MRI on Wednesday at [facility] location but was told that she would have to go to physical therapy first. She had a physical therapy appointment on 11/3 and was told to go home and wait for a call about the MRI but no one has called her.  It does not look like we ordered the MRI but did refer to PT?  Do you know anything about this?  Please advise.
